# Patient Record
Sex: Male | Born: 1940
Health system: Southern US, Community
[De-identification: ages and names within clinical notes are randomized; demographics above are authoritative.]

## PROBLEM LIST (undated history)

## (undated) DIAGNOSIS — M1990 Unspecified osteoarthritis, unspecified site: Secondary | ICD-10-CM

## (undated) DIAGNOSIS — H8110 Benign paroxysmal vertigo, unspecified ear: Secondary | ICD-10-CM

## (undated) DIAGNOSIS — I340 Nonrheumatic mitral (valve) insufficiency: Secondary | ICD-10-CM

## (undated) DIAGNOSIS — I351 Nonrheumatic aortic (valve) insufficiency: Secondary | ICD-10-CM

## (undated) DIAGNOSIS — E669 Obesity, unspecified: Secondary | ICD-10-CM

## (undated) DIAGNOSIS — I10 Essential (primary) hypertension: Secondary | ICD-10-CM

## (undated) DIAGNOSIS — E785 Hyperlipidemia, unspecified: Secondary | ICD-10-CM

## (undated) DIAGNOSIS — I350 Nonrheumatic aortic (valve) stenosis: Secondary | ICD-10-CM

## (undated) DIAGNOSIS — I2510 Atherosclerotic heart disease of native coronary artery without angina pectoris: Secondary | ICD-10-CM

## (undated) DIAGNOSIS — G4733 Obstructive sleep apnea (adult) (pediatric): Secondary | ICD-10-CM

## (undated) DIAGNOSIS — R413 Other amnesia: Secondary | ICD-10-CM

## (undated) HISTORY — DX: Unspecified osteoarthritis, unspecified site: M19.90

## (undated) HISTORY — DX: Nonrheumatic mitral (valve) insufficiency: I34.0

## (undated) HISTORY — DX: Nonrheumatic aortic (valve) insufficiency: I35.1

## (undated) HISTORY — DX: Other amnesia: R41.3

## (undated) HISTORY — PX: APPENDECTOMY: SHX54

## (undated) HISTORY — PX: TONSILLECTOMY: SUR1361

## (undated) HISTORY — PX: CATARACT EXTRACTION: SUR2

## (undated) HISTORY — DX: Benign paroxysmal vertigo, unspecified ear: H81.10

## (undated) HISTORY — DX: Obesity, unspecified: E66.9

## (undated) HISTORY — PX: AORTIC VALVE REPLACEMENT: SHX41

## (undated) HISTORY — DX: Essential (primary) hypertension: I10

## (undated) HISTORY — PX: FEMORAL HERNIA REPAIR: SHX632

## (undated) HISTORY — PX: OTHER SURGICAL HISTORY: SHX169

## (undated) HISTORY — DX: Hyperlipidemia, unspecified: E78.5

## (undated) HISTORY — DX: Nonrheumatic aortic (valve) stenosis: I35.0

## (undated) HISTORY — DX: Atherosclerotic heart disease of native coronary artery without angina pectoris: I25.10

## (undated) HISTORY — DX: Obstructive sleep apnea (adult) (pediatric): G47.33

---

## 1999-03-26 ENCOUNTER — Encounter: Payer: Self-pay | Admitting: Ophthalmology

## 1999-03-26 ENCOUNTER — Ambulatory Visit (HOSPITAL_COMMUNITY): Admission: RE | Admit: 1999-03-26 | Discharge: 1999-03-27 | Payer: Self-pay | Admitting: Ophthalmology

## 2001-01-03 ENCOUNTER — Encounter: Admission: RE | Admit: 2001-01-03 | Discharge: 2001-04-03 | Payer: Self-pay | Admitting: Family Medicine

## 2003-08-21 ENCOUNTER — Ambulatory Visit (HOSPITAL_BASED_OUTPATIENT_CLINIC_OR_DEPARTMENT_OTHER): Admission: RE | Admit: 2003-08-21 | Discharge: 2003-08-21 | Payer: Self-pay | Admitting: Family Medicine

## 2003-11-18 ENCOUNTER — Ambulatory Visit (HOSPITAL_COMMUNITY): Admission: RE | Admit: 2003-11-18 | Discharge: 2003-11-18 | Payer: Self-pay | Admitting: Gastroenterology

## 2004-11-23 ENCOUNTER — Inpatient Hospital Stay (HOSPITAL_COMMUNITY): Admission: RE | Admit: 2004-11-23 | Discharge: 2004-11-29 | Payer: Self-pay | Admitting: Orthopedic Surgery

## 2004-11-23 ENCOUNTER — Ambulatory Visit: Payer: Self-pay | Admitting: Physical Medicine & Rehabilitation

## 2005-08-23 ENCOUNTER — Inpatient Hospital Stay (HOSPITAL_COMMUNITY): Admission: RE | Admit: 2005-08-23 | Discharge: 2005-08-29 | Payer: Self-pay | Admitting: Orthopedic Surgery

## 2005-08-23 ENCOUNTER — Ambulatory Visit: Payer: Self-pay | Admitting: Physical Medicine & Rehabilitation

## 2006-06-29 ENCOUNTER — Emergency Department (HOSPITAL_COMMUNITY): Admission: EM | Admit: 2006-06-29 | Discharge: 2006-06-30 | Payer: Self-pay | Admitting: Emergency Medicine

## 2011-07-19 ENCOUNTER — Other Ambulatory Visit: Payer: Self-pay | Admitting: Interventional Cardiology

## 2011-07-22 ENCOUNTER — Inpatient Hospital Stay (HOSPITAL_BASED_OUTPATIENT_CLINIC_OR_DEPARTMENT_OTHER)
Admission: RE | Admit: 2011-07-22 | Discharge: 2011-07-22 | Disposition: A | Discharge status: Home / Self Care | Payer: Medicare Other | Source: Ambulatory Visit | Attending: Interventional Cardiology | Admitting: Interventional Cardiology

## 2011-07-22 ENCOUNTER — Encounter (HOSPITAL_BASED_OUTPATIENT_CLINIC_OR_DEPARTMENT_OTHER)
Admission: RE | Disposition: A | Discharge status: Home / Self Care | Payer: Self-pay | Source: Ambulatory Visit | Attending: Interventional Cardiology

## 2011-07-22 DIAGNOSIS — I359 Nonrheumatic aortic valve disorder, unspecified: Secondary | ICD-10-CM | POA: Insufficient documentation

## 2011-07-22 DIAGNOSIS — E785 Hyperlipidemia, unspecified: Secondary | ICD-10-CM | POA: Insufficient documentation

## 2011-07-22 DIAGNOSIS — I2510 Atherosclerotic heart disease of native coronary artery without angina pectoris: Secondary | ICD-10-CM | POA: Insufficient documentation

## 2011-07-22 DIAGNOSIS — Z794 Long term (current) use of insulin: Secondary | ICD-10-CM

## 2011-07-22 DIAGNOSIS — I10 Essential (primary) hypertension: Secondary | ICD-10-CM | POA: Insufficient documentation

## 2011-07-22 DIAGNOSIS — G4733 Obstructive sleep apnea (adult) (pediatric): Secondary | ICD-10-CM | POA: Insufficient documentation

## 2011-07-22 DIAGNOSIS — E118 Type 2 diabetes mellitus with unspecified complications: Secondary | ICD-10-CM | POA: Insufficient documentation

## 2011-07-22 LAB — POCT I-STAT 3, VENOUS BLOOD GAS (G3P V)
Acid-base deficit: 3 mmol/L — ABNORMAL HIGH (ref 0.0–2.0)
Bicarbonate: 23.2 mEq/L (ref 20.0–24.0)
O2 Saturation: 69 %
TCO2: 25 mmol/L (ref 0–100)
pO2, Ven: 39 mmHg (ref 30.0–45.0)

## 2011-07-22 LAB — POCT I-STAT 3, ART BLOOD GAS (G3+)
Acid-base deficit: 1 mmol/L (ref 0.0–2.0)
Acid-base deficit: 1 mmol/L (ref 0.0–2.0)
Bicarbonate: 25.2 mEq/L — ABNORMAL HIGH (ref 20.0–24.0)
O2 Saturation: 93 %
O2 Saturation: 97 %
TCO2: 27 mmol/L (ref 0–100)
pCO2 arterial: 45.6 mmHg — ABNORMAL HIGH (ref 35.0–45.0)
pH, Arterial: 7.35 (ref 7.350–7.450)
pO2, Arterial: 72 mmHg — ABNORMAL LOW (ref 80.0–100.0)
pO2, Arterial: 98 mmHg (ref 80.0–100.0)

## 2011-07-22 LAB — POCT I-STAT GLUCOSE: Glucose, Bld: 142 mg/dL — ABNORMAL HIGH (ref 70–99)

## 2011-07-22 SURGERY — JV LEFT AND RIGHT HEART CATHETERIZATION WITH CORONARY ANGIOGRAM
Anesthesia: Moderate Sedation

## 2011-07-22 MED ORDER — SIMVASTATIN 40 MG PO TABS: 40.0000 mg | ORAL_TABLET | Freq: Every day | ORAL | Status: DC

## 2011-07-22 MED ORDER — OXYCODONE-ACETAMINOPHEN 5-325 MG PO TABS: 1.0000 | ORAL_TABLET | ORAL | Status: DC | PRN

## 2011-07-22 MED ORDER — SODIUM CHLORIDE 0.9 % IJ SOLN: 3.0000 mL | INTRAMUSCULAR | Status: DC | PRN

## 2011-07-22 MED ORDER — ASPIRIN 81 MG PO CHEW
324.0000 mg | CHEWABLE_TABLET | ORAL | Status: AC
  Administered 2011-07-22: 324 mg via ORAL

## 2011-07-22 MED ORDER — ONDANSETRON HCL 4 MG/2ML IJ SOLN: 4.0000 mg | Freq: Four times a day (QID) | INTRAMUSCULAR | Status: DC | PRN

## 2011-07-22 MED ORDER — SODIUM CHLORIDE 0.9 % IV SOLN: INTRAVENOUS | Status: DC

## 2011-07-22 MED ORDER — SODIUM CHLORIDE 0.9 % IJ SOLN: 3.0000 mL | Freq: Two times a day (BID) | INTRAMUSCULAR | Status: DC

## 2011-07-22 MED ORDER — ACETAMINOPHEN 325 MG PO TABS: 650.0000 mg | ORAL_TABLET | ORAL | Status: DC | PRN

## 2011-07-22 MED ORDER — AMLODIPINE BESYLATE 5 MG PO TABS: 5.0000 mg | ORAL_TABLET | Freq: Every day | ORAL | Status: DC

## 2011-07-22 MED ORDER — SODIUM CHLORIDE 0.9 % IV SOLN
INTRAVENOUS | Status: DC
  Administered 2011-07-22: 07:00:00 via INTRAVENOUS

## 2011-07-22 MED ORDER — DIAZEPAM 5 MG PO TABS
5.0000 mg | ORAL_TABLET | ORAL | Status: AC
  Administered 2011-07-22: 5 mg via ORAL

## 2011-07-22 MED ORDER — RAMIPRIL 10 MG PO TABS: 10.0000 mg | ORAL_TABLET | Freq: Every day | ORAL | Status: DC

## 2011-07-22 MED ORDER — SODIUM CHLORIDE 0.9 % IV SOLN: 250.0000 mL | INTRAVENOUS | Status: DC | PRN

## 2011-07-22 MED ORDER — METFORMIN HCL 500 MG PO TABS: 1000.0000 mg | ORAL_TABLET | ORAL | Status: DC

## 2011-07-22 NOTE — Op Note (Signed)
     Diagnostic Cardiac Catheterization Report  NIKKO GOLDWIRE  71 y.o.  male February 01, 1941  Procedure Date: 07/22/2011 Referring Physician:  Catha Gosselin, M.D. Primary Cardiologist:: Gwynneth Albright, M.D.   PROCEDURE:  Left and right heart catheterization with selective coronary angiography, left ventriculogram.  INDICATIONS:  Severe aortic stenosis. The studies being done to document coronary anatomy and confirm noninvasive hemodynamics.  The risks, benefits, and details of the procedure were explained to the patient.  The patient verbalized understanding and wanted to proceed.  Informed written consent was obtained.  PROCEDURE TECHNIQUE:  After Xylocaine anesthesia a 4 French arterial and 7 French venous sheath was placed in the right femoral artery and vein respectively, each with a single anterior needle wall stick.   Coronary angiography was done using a 4 [place] JL 4 and JR 4 catheter.  Left ventriculography was done using the 4 [place] JR 4 catheter. A straight wire was used to cross the aortic valve using the JR 4 cath.    CONTRAST:  Total of 65 cc.  COMPLICATIONS:  None.    HEMODYNAMICS:  Aortic pressure was 107/52 mmHg; LV pressure was 165/9 mmHg; LVEDP 17 mm mercury; right atrial mean 4 mm mercury; right ventricular 29/8 mmHg; main pulmonary artery 25/8 mmHg; pulmonary capillary wedge 7 mmHg mean. The peak to peak gradient 58 mmHg; mean gradient 41 mm mercury; cardiac outputs 5.6 L per minute (by both Fick and thermodilution). Aortic valve area 1.03 cm square.     ANGIOGRAPHIC DATA:   The left main coronary artery is heavy calcification but widely patent.  The left anterior descending artery is heavy proximal LAD and diagonal calcification with up to 40% mid vessel stenosis just beyond the first diagonal. The LAD is transapical. One large first diagonal contains an eccentric ostial 50-70% stenosis..  The left circumflex artery is to, the obtuse marginal branches with the first  marginal containing proximal eccentric 40-50% narrowing irregularities are noted in the mid circumflex no significant stenosis is seen throughout the circumflex system. The circumflex is codominant..  The right coronary artery is codominant with proximal eccentric 50% narrowing. Significant calcification is noted.  LEFT VENTRICULOGRAM:  Left ventricular angiogram was done in the 30 RAO projection and revealed normal left ventricular wall motion and systolic function with an estimated ejection fraction of 60% %.  LVEDP was 17 mmHg.  IMPRESSIONS:  1. Severe calcific aortic stenosis with peak to peak gradient of 58 mmHg, mean gradient of 41 mm mercury, and calculated valve area of 1.03 cm square.  2. Heavy coronary calcification with up to 50% eccentric mid to proximal LAD, 50-70% ostial diagonal, 40% first obtuse marginal, and 50-60% proximal codominant RCA. I believe that all of the patient's coronary lesions are less than hemodynamically significant. He has no anginal complaints.  3. Preserved left ventricular systolic function with mildly elevated end-diastolic pressure consistent with mild diastolic dysfunction.   RECOMMENDATION:  Followup with a cardiac surgeon to contemplate and plan aortic valve replacement, potentially by a minithoracotomy approach.

## 2011-07-22 NOTE — Progress Notes (Signed)
Bedrest begins @ 0845, Dr. Katrinka Blazing in to discuss results with patient and family.

## 2011-07-22 NOTE — H&P (Signed)
  The patient has aortic stenosis documented by echo. He has exertional dyspnea and fatigue. He has undergone heart catheterization to complete evaluation and to be referred for surgical considerations. The procedure including its risks of stroke, death, heart attack, renal failure, limb ischemia, bleeding, allergy, etc. were discussed in detail except above patient.

## 2011-07-22 NOTE — Progress Notes (Signed)
Discharge instructions completed, ambulated to bathroom without bleeding from right groin site.  Discharged to home via wheelchair with wife and family. 

## 2011-07-25 ENCOUNTER — Encounter: Payer: Self-pay | Admitting: Thoracic Surgery (Cardiothoracic Vascular Surgery)

## 2011-07-25 ENCOUNTER — Other Ambulatory Visit: Payer: Self-pay | Admitting: Thoracic Surgery (Cardiothoracic Vascular Surgery)

## 2011-07-25 ENCOUNTER — Institutional Professional Consult (permissible substitution) (INDEPENDENT_AMBULATORY_CARE_PROVIDER_SITE_OTHER): Payer: Medicare Other | Admitting: Thoracic Surgery (Cardiothoracic Vascular Surgery)

## 2011-07-25 VITALS — BP 142/77 | HR 62 | Resp 20 | Ht 72.75 in | Wt 242.0 lb

## 2011-07-25 DIAGNOSIS — I77810 Thoracic aortic ectasia: Secondary | ICD-10-CM

## 2011-07-25 DIAGNOSIS — I359 Nonrheumatic aortic valve disorder, unspecified: Secondary | ICD-10-CM

## 2011-07-25 DIAGNOSIS — M1990 Unspecified osteoarthritis, unspecified site: Secondary | ICD-10-CM | POA: Insufficient documentation

## 2011-07-25 DIAGNOSIS — I82409 Acute embolism and thrombosis of unspecified deep veins of unspecified lower extremity: Secondary | ICD-10-CM

## 2011-07-25 DIAGNOSIS — I350 Nonrheumatic aortic (valve) stenosis: Secondary | ICD-10-CM

## 2011-07-25 DIAGNOSIS — I7409 Other arterial embolism and thrombosis of abdominal aorta: Secondary | ICD-10-CM

## 2011-07-25 NOTE — Progress Notes (Signed)
[address]            [place] [address]          [phone]     CARDIOTHORACIC SURGERY CONSULTATION REPORT  PCP is Patrick Hillier, MD, MD Referring Provider is Patrick Kells, MD  Chief Complaint  Patient presents with  . 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 The patient states that he was first noted to have a heart murmur on routine physical exam several years ago. An echocardiogram revealed aortic stenosis, and he has been followed ever since on a regular basis. Over the past year the patient has developed symptoms of exertional shortness of breath that typically only occur when the patient is doing something relatively strenuous such as mowing his lawn or walking uphill. He noticed this particularly last fall walking into the football stadium at [facility] on the way to a football game. Symptoms are associated with mild tightness across her chest and always relieved by rest. The patient was seen in followup recently by Dr. Katrinka Henson and echocardiogram demonstrates progression of severity of aortic stenosis. Left and right heart catheterization was performed confirming the presence of severe aortic stenosis with moderate nonobstructive coronary artery disease. The patient has been referred for possible elective surgical intervention.  Past Medical History  Diagnosis Date  . Aortic stenosis     AVA .96 cm2.2011  . Diabetes mellitus   . Hypertension   . Hyperlipidemia   . Arthritis   . Obstructive sleep apnea     Past Surgical History  Procedure Date  . Right hip placement     2007  . Left knee replacement     2006  . Appendectomy   . Tonsillectomy   . Femoral hernia repair     History reviewed. No pertinent  family history.  Social History History  Substance Use Topics  . Smoking status: Never Smoker   . Smokeless tobacco: Never Used  . Alcohol Use: Not on file    Current Outpatient Prescriptions  Medication Sig Dispense Refill  . amLODipine (NORVASC) 5 MG tablet Take 5 mg by mouth daily.      Marland Kitchen atorvastatin (LIPITOR) 20 MG tablet Take 20 mg by mouth daily.      . metFORMIN (GLUCOPHAGE) 1000 MG tablet Take 1,000 mg by mouth 1 day or 1 dose.      . ramipril (ALTACE) 10 MG tablet Take 10 mg by mouth daily.      . pioglitazone (ACTOS) 15 MG tablet Take 15 mg by mouth daily.         No Known Allergies  Review of Systems:  General:  normal appetite, normal energy   Respiratory:  no cough, no wheezing, no hemoptysis, no pain with inspiration or cough, + exertional shortness of breath   Cardiac:  + chest pain or tightness with exertion, + exertional SOB, no resting SOB, no PND, no orthopnea, + LE edema R>L, no palpitations, no syncope, occasional dizziness  GI:   no difficulty swallowing, no hematochezia, no hematemesis, no melena, no constipation, no diarrhea   GU:   no dysuria, no urgency, + nocturia and frequency   Musculoskeletal: +  arthritis and arthralgias, particularly in hips and knees  Vascular:  no pain suggestive of claudication   Neuro:   no symptoms suggestive of TIA's, no seizures, no headaches, no peripheral neuropathy, + gradual decline in short and medium term memory over last few years, particularly this year  Endocrine:  Negative   Heme:   Easy bruising on ASA therapy  HEENT:  no loose teeth or painful teeth,  no recent vision changes  Psych:   no anxiety, no depression    Physical Exam:   BP 142/77  Pulse 62  Resp 20  Ht 6' 0.75" (1.848 m)  Wt 242 lb (109.77 kg)  BMI 32.15 kg/m2  SpO2 99%  General:    well-appearing  HEENT:  Unremarkable   Neck:   no JVD, no bruits, no adenopathy   Chest:   clear to auscultation, symmetrical breath sounds, no wheezes, no  rhonchi   CV:   RRR, grade III/VI systolic murmur along sternal border  Abdomen:  soft, non-tender, no masses   Extremities:  warm, well-perfused, pulses not palpable, moderate-severe RLE edema, mild-moderate LLE edema  Rectal/GU  Deferred  Neuro:   Grossly non-focal and symmetrical throughout  Skin:   Clean and dry, no rashes, no breakdown, hair loss and chronic venous insuff. B LE's R>L   Diagnostic Tests:  Transthoracic echocardiogram performed 06/27/2011 is reviewed. There is severe calcific aortic stenosis. There is severe restriction of leaflet motion of aortic valve with heavy calcification. There is only trace aortic insufficiency. The peak velocity across the valve was measured 426 cm/s corresponding to peak and mean transvalvular gradients estimated to be 73 and 48 mm mercury respectively. Aortic valve area was estimated 0.9 cm. There was normal left ventricular systolic function with ejection fraction estimated to be greater than 60%. There was mild mitral annular calcification without mitral regurgitation. There is moderate concentric left ventricular hypertrophy. There is grade 2 diastolic dysfunction with elevated left atrial pressure. There was trivial tricuspid regurgitation.       Diagnostic Cardiac Catheterization Report  Patrick Henson  71 y.o.   male 02-Sep-1940  Procedure Date: 07/22/2011 Referring Physician:  Catha Henson, M.D. Primary Cardiologist:: Patrick Henson, M.D.   PROCEDURE:  Left and right heart catheterization with selective coronary angiography, left ventriculogram.  HEMODYNAMICS:  Aortic pressure was 107/52 mmHg; LV pressure was 165/9 mmHg; LVEDP 17 mm mercury; right atrial mean 4 mm mercury; right ventricular 29/8 mmHg; main pulmonary artery 25/8 mmHg; pulmonary capillary wedge 7 mmHg mean. The peak to peak gradient 58 mmHg; mean gradient 41 mm mercury; cardiac outputs 5.6 L per minute (by both Fick and thermodilution). Aortic valve area 1.03 cm  square.     ANGIOGRAPHIC DATA:   The left main coronary artery is heavy calcification but widely patent.  The left anterior descending artery is heavy proximal LAD and diagonal calcification with up to 40% mid vessel stenosis just beyond the first diagonal. The LAD is transapical. One large first diagonal contains an eccentric ostial 50-70% stenosis..  The left circumflex artery is to, the obtuse marginal branches with the first marginal containing proximal eccentric 40-50% narrowing irregularities are noted in the mid circumflex no significant stenosis is seen throughout the circumflex system. The circumflex is codominant..  The right coronary artery is codominant with proximal eccentric 50% narrowing. Significant calcification is noted.  LEFT VENTRICULOGRAM:  Left ventricular angiogram was done in the 30 RAO projection and revealed normal left ventricular wall motion and systolic  function with an estimated ejection fraction of 60% %.  LVEDP was 17 mmHg.            Impression:  Severe calcific aortic stenosis with normal left ventricular systolic function and gradual aggression of symptoms of exertional shortness of breath and chest discomfort. I agree that the patient would best be treated with elective aortic valve replacement. He has moderate nonobstructive coronary artery disease.  He does have fairly severe lower extremity edema which is and reportedly has been quite asymmetric, raising the possibility of possible DVT in the past.  He also reports some noticeable cognitive decline recently with memory disturbance.  He is interested in possible minimally invasive approach for surgery.    Plan:  The patient was counseled at length regarding surgical alternatives with respect to valve replacement. In particular, discussion was held comparing in contrast and the risks of mechanical valve replacement and the need for lifelong anticoagulation versus use of a bioprosthetic tissue valve and  the associated potential for late structural valve deterioration in failure.  This discussion was placed in the context of the patient's age and other circumstances, and as a result the patient specifically requests that their valve be replaced using a bioprosthetic tissue valve.  They understand and accept all potential associated risks of surgery including but not limited to risk of death, stroke, myocardial infarction, congestive heart failure, respiratory failure, renal failure, pneumonia, bleeding requiring blood transfusion and or reexploration, arrhythmia, heart block or bradycardia requiring permanent pacemaker, pleural effusions or other delayed complications related to continued congestive heart failure, or other late complications related to valve replacement.  Alternative surgical approaches have been discussed, including a comparison between conventional sternotomy and minimally-invasive techniques.  The relative risks and benefits of each have been reviewed as they pertain to the patient's specific circumstances, and all of their questions have been addressed.  We plan to obtain lower extremity duplex scan to rule out deep venous thrombosis. We will obtain cardiac gated CT angiogram of the heart to evaluate the morphology of the aortic valve and aortic root to better ascertain whether or not he will be a good candidate for mini thoracotomy approach for surgery. We'll also obtain CT angiogram of the abdomen and pelvis to rule out aortoiliac occlusive disease because possible need for femoral cannulation for surgery. We will plan to see the patient back in 2 weeks to further discuss plans for possible elective surgery in the near future. All of his questions been addressed.      Patrick Henson. Cornelius Moras, MD 07/25/2011 3:27 PM

## 2011-07-26 ENCOUNTER — Ambulatory Visit (HOSPITAL_COMMUNITY)
Admission: RE | Admit: 2011-07-26 | Discharge: 2011-07-26 | Disposition: A | Discharge status: Home / Self Care | Payer: Medicare Other | Source: Ambulatory Visit | Attending: Thoracic Surgery (Cardiothoracic Vascular Surgery) | Admitting: Thoracic Surgery (Cardiothoracic Vascular Surgery)

## 2011-07-26 ENCOUNTER — Other Ambulatory Visit: Payer: Self-pay | Admitting: Thoracic Surgery (Cardiothoracic Vascular Surgery)

## 2011-07-26 DIAGNOSIS — I719 Aortic aneurysm of unspecified site, without rupture: Secondary | ICD-10-CM

## 2011-07-26 DIAGNOSIS — I82409 Acute embolism and thrombosis of unspecified deep veins of unspecified lower extremity: Secondary | ICD-10-CM

## 2011-07-26 DIAGNOSIS — Q2543 Congenital aneurysm of aorta: Secondary | ICD-10-CM

## 2011-07-26 DIAGNOSIS — M7989 Other specified soft tissue disorders: Secondary | ICD-10-CM | POA: Insufficient documentation

## 2011-07-26 DIAGNOSIS — I350 Nonrheumatic aortic (valve) stenosis: Secondary | ICD-10-CM

## 2011-07-26 DIAGNOSIS — M79609 Pain in unspecified limb: Secondary | ICD-10-CM | POA: Insufficient documentation

## 2011-07-26 NOTE — Progress Notes (Signed)
VASCULAR LAB PRELIMINARY  PRELIMINARY  PRELIMINARY  PRELIMINARY  Bilateral lower extremity venous has been completed.    Preliminary report: No obvious evidence of deep or superficial vein thrombosis noted bilaterally. Vanna Scotland, 07/26/2011, 11:40 AM

## 2011-07-29 ENCOUNTER — Ambulatory Visit (HOSPITAL_COMMUNITY)
Admission: RE | Admit: 2011-07-29 | Discharge: 2011-07-29 | Disposition: A | Discharge status: Home / Self Care | Payer: Medicare Other | Source: Ambulatory Visit | Attending: Thoracic Surgery (Cardiothoracic Vascular Surgery) | Admitting: Thoracic Surgery (Cardiothoracic Vascular Surgery)

## 2011-07-29 ENCOUNTER — Encounter (HOSPITAL_COMMUNITY): Payer: Self-pay

## 2011-07-29 ENCOUNTER — Other Ambulatory Visit: Payer: Self-pay | Admitting: Thoracic Surgery (Cardiothoracic Vascular Surgery)

## 2011-07-29 ENCOUNTER — Inpatient Hospital Stay: Admission: RE | Admit: 2011-07-29 | Payer: Medicare Other | Source: Ambulatory Visit

## 2011-07-29 DIAGNOSIS — K8020 Calculus of gallbladder without cholecystitis without obstruction: Secondary | ICD-10-CM | POA: Insufficient documentation

## 2011-07-29 DIAGNOSIS — Q619 Cystic kidney disease, unspecified: Secondary | ICD-10-CM | POA: Insufficient documentation

## 2011-07-29 DIAGNOSIS — I7409 Other arterial embolism and thrombosis of abdominal aorta: Secondary | ICD-10-CM | POA: Insufficient documentation

## 2011-07-29 DIAGNOSIS — I77819 Aortic ectasia, unspecified site: Secondary | ICD-10-CM | POA: Insufficient documentation

## 2011-07-29 DIAGNOSIS — I359 Nonrheumatic aortic valve disorder, unspecified: Secondary | ICD-10-CM

## 2011-07-29 DIAGNOSIS — I350 Nonrheumatic aortic (valve) stenosis: Secondary | ICD-10-CM

## 2011-07-29 DIAGNOSIS — I77810 Thoracic aortic ectasia: Secondary | ICD-10-CM

## 2011-07-29 MED ORDER — NITROGLYCERIN 0.4 MG SL SUBL
0.4000 mg | SUBLINGUAL_TABLET | Freq: Once | SUBLINGUAL | Status: AC
  Administered 2011-07-29: 0.4 mg via SUBLINGUAL
  Filled 2011-07-29: qty 25

## 2011-07-29 MED ORDER — IOHEXOL 350 MG/ML SOLN: 70.0000 mL | Freq: Once | INTRAVENOUS | Status: AC | PRN

## 2011-07-29 MED ORDER — IOHEXOL 350 MG/ML SOLN: 80.0000 mL | Freq: Once | INTRAVENOUS | Status: DC | PRN

## 2011-07-29 MED ORDER — METOPROLOL TARTRATE 1 MG/ML IV SOLN
INTRAVENOUS | Status: AC
  Filled 2011-07-29: qty 10

## 2011-07-29 MED ORDER — NITROGLYCERIN 0.4 MG SL SUBL
SUBLINGUAL_TABLET | SUBLINGUAL | Status: AC
  Filled 2011-07-29: qty 25

## 2011-07-29 MED ORDER — METOPROLOL TARTRATE 1 MG/ML IV SOLN
5.0000 mg | Freq: Once | INTRAVENOUS | Status: AC
  Administered 2011-07-29: 5 mg via INTRAVENOUS
  Filled 2011-07-29: qty 5

## 2011-08-08 ENCOUNTER — Ambulatory Visit: Payer: Medicare Other | Admitting: Thoracic Surgery (Cardiothoracic Vascular Surgery)

## 2011-10-06 ENCOUNTER — Encounter (HOSPITAL_COMMUNITY)
Admission: RE | Admit: 2011-10-06 | Discharge: 2011-10-06 | Disposition: A | Discharge status: Home / Self Care | Payer: Medicare Other | Source: Ambulatory Visit | Attending: Interventional Cardiology | Admitting: Interventional Cardiology

## 2011-10-06 DIAGNOSIS — I10 Essential (primary) hypertension: Secondary | ICD-10-CM | POA: Insufficient documentation

## 2011-10-06 DIAGNOSIS — E119 Type 2 diabetes mellitus without complications: Secondary | ICD-10-CM | POA: Insufficient documentation

## 2011-10-06 DIAGNOSIS — G4733 Obstructive sleep apnea (adult) (pediatric): Secondary | ICD-10-CM | POA: Insufficient documentation

## 2011-10-06 DIAGNOSIS — I359 Nonrheumatic aortic valve disorder, unspecified: Secondary | ICD-10-CM | POA: Insufficient documentation

## 2011-10-06 DIAGNOSIS — E785 Hyperlipidemia, unspecified: Secondary | ICD-10-CM | POA: Insufficient documentation

## 2011-10-06 DIAGNOSIS — Z5189 Encounter for other specified aftercare: Secondary | ICD-10-CM | POA: Insufficient documentation

## 2011-10-10 ENCOUNTER — Encounter (HOSPITAL_COMMUNITY)
Admission: RE | Admit: 2011-10-10 | Discharge: 2011-10-10 | Disposition: A | Discharge status: Home / Self Care | Payer: Medicare Other | Source: Ambulatory Visit | Attending: Interventional Cardiology | Admitting: Interventional Cardiology

## 2011-10-10 ENCOUNTER — Encounter (HOSPITAL_COMMUNITY): Payer: Self-pay

## 2011-10-10 LAB — GLUCOSE, CAPILLARY
Glucose-Capillary: 174 mg/dL — ABNORMAL HIGH (ref 70–99)
Glucose-Capillary: 115 mg/dL — ABNORMAL HIGH (ref 70–99)

## 2011-10-10 NOTE — Progress Notes (Signed)
Pt started cardiac rehab today.  Pt tolerated light exercise without difficulty.  Pt asymptomatic with exercise.  Telemetry- SR, BBB, PVC with exercise.  VSS.  Pt oriented to exercise equipment and routine.  Understanding verbalized.  Will continue to monitor the patient throughout  the program.

## 2011-10-12 ENCOUNTER — Encounter (HOSPITAL_COMMUNITY)
Admission: RE | Admit: 2011-10-12 | Discharge: 2011-10-12 | Disposition: A | Discharge status: Home / Self Care | Payer: Medicare Other | Source: Ambulatory Visit | Attending: Interventional Cardiology | Admitting: Interventional Cardiology

## 2011-10-13 NOTE — Progress Notes (Signed)
Patrick Henson 71 y.o. male       Nutrition Screen                                                                    YES  NO Do you live in a nursing home?  X   Do you eat out more than 3 times/week?    X If yes, how many times per week do you eat out?  Do you have food allergies?   X If yes, what are you allergic to?  Have you gained or lost more than 10 lbs without trying?               X If yes, how much weight have you lost and over what time period?  Do you want to lose weight?    X  If yes, what is a goal weight or amount of weight you would like to lose? 5-10 lb  Do you eat alone most of the time?   X   Do you eat less than 2 meals/day?  X If yes, how many meals do you eat?    Do you drink more than 3 alcohol drinks/day?  X If yes, how many drinks per day?   Are you having trouble with constipation? *  X If yes, what are you doing to help relieve constipation?  Do you have financial difficulties with buying food?*    X   Are you experiencing regular nausea/ vomiting?*     X   Do you have a poor appetite? *                                        X   Do you have trouble chewing/swallowing? *   X    Pt with diagnoses of:  X AVR/MVR/ICD      X Dyslipidemia  / HDL< 40 / LDL>70 / High TG      X %  Body fat >goal / Body Mass Index >25 X HTN / BP >120/80 X DM/A1c >6 / CBG >126       Pt Risk Score   1       Diagnosis Risk Score  70       Total Risk Score   71                        X High Risk                Low Risk    HT: 73.5" Ht Readings from Last 1 Encounters:  10/06/11 6' 1.5" (1.867 m)    WT:   240.9 lb (109.5 kg) Wt Readings from Last 3 Encounters:  10/06/11 241 lb 6.5 oz (109.5 kg)  07/25/11 242 lb (109.77 kg)  07/22/11 244 lb (110.678 kg)     IBW 85 129%IBW BMI 31.4 29.2%body fat  Meds reviewed: Chelated Magnesium, Vitamin D3, Metformin, Juice Plus fiber supplement, Fish oil, Actos Past Medical History  Diagnosis Date  . Aortic stenosis     AVA .96 cm2.2011  .  Diabetes mellitus   . Hypertension   .  Hyperlipidemia   . Arthritis   . Obstructive sleep apnea        Activity level: Pt is sedentary  Wt goal: 216-228 lb ( 98.2-103.6 kg) Current tobacco use? No Food/Drug Interaction? No Labs:  Lipid Panel  No results found for this basename: chol, trig, hdl, cholhdl, vldl, ldlcalc   No results found for this basename: HGBA1C   07/22/11 Glucose 142  LDL goal: < 70      DM and > 2:      HTN, > 71 yo male Estimated Daily Nutrition Needs for: ? wt loss  1700-2200 Kcal , Total Fat 45-60gm, Saturated Fat 12-17 gm, Trans Fat 1.6-2.2 gm,  Sodium less than 1500 mg, and 250 gm CHO

## 2011-10-14 ENCOUNTER — Encounter (HOSPITAL_COMMUNITY)
Admission: RE | Admit: 2011-10-14 | Discharge: 2011-10-14 | Disposition: A | Discharge status: Home / Self Care | Payer: Medicare Other | Source: Ambulatory Visit | Attending: Interventional Cardiology | Admitting: Interventional Cardiology

## 2011-10-17 ENCOUNTER — Encounter (HOSPITAL_COMMUNITY)
Admission: RE | Admit: 2011-10-17 | Discharge: 2011-10-17 | Payer: Medicare Other | Source: Ambulatory Visit | Attending: Interventional Cardiology | Admitting: Interventional Cardiology

## 2011-10-17 NOTE — Progress Notes (Signed)
Pt arrived at cardiac rehab c/o low grade fever, cough and bilateral rib discomfort X2-3days.  Pt states he has appt today with PCP. Pt will not exercise and will return when afebrile X24 hours and cough/congestion resolved.   Understanding verbalized

## 2011-10-19 ENCOUNTER — Telehealth (HOSPITAL_COMMUNITY): Payer: Self-pay | Admitting: Cardiac Rehabilitation

## 2011-10-19 ENCOUNTER — Encounter (HOSPITAL_COMMUNITY): Payer: Medicare Other

## 2011-10-21 ENCOUNTER — Encounter (HOSPITAL_COMMUNITY): Payer: Medicare Other

## 2011-10-26 ENCOUNTER — Encounter (HOSPITAL_COMMUNITY)
Admission: RE | Admit: 2011-10-26 | Discharge: 2011-10-26 | Disposition: A | Discharge status: Home / Self Care | Payer: Medicare Other | Source: Ambulatory Visit | Attending: Interventional Cardiology | Admitting: Interventional Cardiology

## 2011-10-26 NOTE — Progress Notes (Signed)
Reviewed home exercise with pt today.  Pt plans to walk at home for exercise.  Reviewed THR, pulse, RPE, sign and symptoms, and when to call 911 or MD.  Pt voiced understanding. Electronically signed by Harriett Sine MS on Wednesday Oct 26 2011 at [phone]

## 2011-10-28 ENCOUNTER — Encounter (HOSPITAL_COMMUNITY)
Admission: RE | Admit: 2011-10-28 | Discharge: 2011-10-28 | Disposition: A | Discharge status: Home / Self Care | Payer: Medicare Other | Source: Ambulatory Visit | Attending: Interventional Cardiology | Admitting: Interventional Cardiology

## 2011-10-28 NOTE — Progress Notes (Signed)
Patrick Henson 71 y.o. male Nutrition Note  Spoke with pt.  Nutrition Plan, Nutrition Survey, and cholesterol goals reviewed with pt. Pt is not following the Therapeutic Lifestyle Changes diet. There are many areas in pt diet that need improvement for pt diet to become heart healthy. Areas for improvement reviewed in detail (e.g. Switching from 2% milk to 1% or skim, choosing low-fat cheese, low-fat salad dressing and mayo). Pt states he wants to lose wt. Pt wt is down 2.4 kg (5.3 lb) over the past 2 weeks. Rate of wt loss slightly greater than desired rate. Wt loss tips discussed. Pt is diabetic. Per pt, he saw Dr. Sharl Ma last week and his A1c was "5.9 or 6.1." Pt reports he was taken off of insulin and Actos and remains on Metformin. Pt states he has been previously educated re: DM. DM education classes offered through Cardiac Rehab reviewed with pt. Pt expressed understanding of the above information reviewed. Nutrition Diagnosis   Food-and nutrition-related knowledge deficit related to lack of exposure to information as related to diagnosis of: ? CVD ? DM   Obesity related to excessive energy intake as evidenced by a BMI of 31.4  Nutrition RX/ Estimated Daily Nutrition Needs for: wt loss  1700-2200 Kcal, 45-60 gm fat, 12-17 gm sat fat, 1.6-2.2 gm trans-fat, <1500 mg sodium , 250 gm CHO   Nutrition Intervention   Pt's individual nutrition plan including cholesterol goals reviewed with pt.   Benefits of adopting Therapeutic Lifestyle Changes discussed when Medficts reviewed.   Pt to attend the Portion Distortion class   Pt to attend the  ? Nutrition I class                          ? Nutrition II class        ? Diabetes Blitz class       ? Diabetes Q & A class   Pt given handouts for: ? wt loss ? DM    Continue client-centered nutrition education by RD, as part of interdisciplinary care. Goal(s)   Pt to identify and limit food sources of saturated fat, trans fat, and cholesterol   Pt to identify  food quantities necessary to achieve: ? wt loss to a goal wt of 216-228 lb (98.2-103.6 kg) at graduation from cardiac rehab.  Monitor and Evaluate progress toward nutrition goal with team.

## 2011-10-31 ENCOUNTER — Encounter (HOSPITAL_COMMUNITY)
Admission: RE | Admit: 2011-10-31 | Discharge: 2011-10-31 | Disposition: A | Discharge status: Home / Self Care | Payer: Medicare Other | Source: Ambulatory Visit | Attending: Interventional Cardiology | Admitting: Interventional Cardiology

## 2011-10-31 DIAGNOSIS — Z5189 Encounter for other specified aftercare: Secondary | ICD-10-CM | POA: Insufficient documentation

## 2011-10-31 DIAGNOSIS — I10 Essential (primary) hypertension: Secondary | ICD-10-CM | POA: Insufficient documentation

## 2011-10-31 DIAGNOSIS — E119 Type 2 diabetes mellitus without complications: Secondary | ICD-10-CM | POA: Insufficient documentation

## 2011-10-31 DIAGNOSIS — I359 Nonrheumatic aortic valve disorder, unspecified: Secondary | ICD-10-CM | POA: Insufficient documentation

## 2011-10-31 DIAGNOSIS — E785 Hyperlipidemia, unspecified: Secondary | ICD-10-CM | POA: Insufficient documentation

## 2011-10-31 DIAGNOSIS — G4733 Obstructive sleep apnea (adult) (pediatric): Secondary | ICD-10-CM | POA: Insufficient documentation

## 2011-11-02 ENCOUNTER — Encounter (HOSPITAL_COMMUNITY)
Admission: RE | Admit: 2011-11-02 | Discharge: 2011-11-02 | Disposition: A | Discharge status: Home / Self Care | Payer: Medicare Other | Source: Ambulatory Visit | Attending: Interventional Cardiology | Admitting: Interventional Cardiology

## 2011-11-04 ENCOUNTER — Encounter (HOSPITAL_COMMUNITY)
Admission: RE | Admit: 2011-11-04 | Discharge: 2011-11-04 | Disposition: A | Discharge status: Home / Self Care | Payer: Medicare Other | Source: Ambulatory Visit | Attending: Interventional Cardiology | Admitting: Interventional Cardiology

## 2011-11-07 ENCOUNTER — Encounter (HOSPITAL_COMMUNITY)
Admission: RE | Admit: 2011-11-07 | Discharge: 2011-11-07 | Disposition: A | Discharge status: Home / Self Care | Payer: Medicare Other | Source: Ambulatory Visit | Attending: Interventional Cardiology | Admitting: Interventional Cardiology

## 2011-11-09 ENCOUNTER — Encounter (HOSPITAL_COMMUNITY)
Admission: RE | Admit: 2011-11-09 | Discharge: 2011-11-09 | Disposition: A | Discharge status: Home / Self Care | Payer: Medicare Other | Source: Ambulatory Visit | Attending: Interventional Cardiology | Admitting: Interventional Cardiology

## 2011-11-09 NOTE — Progress Notes (Signed)
Pt hypertensive at rest and with exercise today at cardiac rehab. Pt denies missed meds or change in diet.  Pt has appt today with Dr. Katrinka Blazing.  Rehab report sent with pt and phone call to Dr. Michaelle Copas nurse to make aware.

## 2011-11-11 ENCOUNTER — Encounter (HOSPITAL_COMMUNITY)
Admission: RE | Admit: 2011-11-11 | Discharge: 2011-11-11 | Disposition: A | Discharge status: Home / Self Care | Payer: Medicare Other | Source: Ambulatory Visit | Attending: Interventional Cardiology | Admitting: Interventional Cardiology

## 2011-11-14 ENCOUNTER — Encounter (HOSPITAL_COMMUNITY)
Admission: RE | Admit: 2011-11-14 | Discharge: 2011-11-14 | Disposition: A | Discharge status: Home / Self Care | Payer: Medicare Other | Source: Ambulatory Visit | Attending: Interventional Cardiology | Admitting: Interventional Cardiology

## 2011-11-16 ENCOUNTER — Encounter (HOSPITAL_COMMUNITY)
Admission: RE | Admit: 2011-11-16 | Discharge: 2011-11-16 | Disposition: A | Discharge status: Home / Self Care | Payer: Medicare Other | Source: Ambulatory Visit | Attending: Interventional Cardiology | Admitting: Interventional Cardiology

## 2011-11-18 ENCOUNTER — Encounter (HOSPITAL_COMMUNITY): Payer: Medicare Other

## 2011-11-21 ENCOUNTER — Encounter (HOSPITAL_COMMUNITY): Payer: Medicare Other

## 2011-11-23 ENCOUNTER — Encounter (HOSPITAL_COMMUNITY): Payer: Medicare Other

## 2011-11-25 ENCOUNTER — Encounter (HOSPITAL_COMMUNITY): Payer: Medicare Other

## 2011-11-28 ENCOUNTER — Encounter (HOSPITAL_COMMUNITY): Payer: Medicare Other

## 2011-11-30 ENCOUNTER — Encounter (HOSPITAL_COMMUNITY)
Admission: RE | Admit: 2011-11-30 | Discharge: 2011-11-30 | Disposition: A | Discharge status: Home / Self Care | Payer: Medicare Other | Source: Ambulatory Visit | Attending: Interventional Cardiology | Admitting: Interventional Cardiology

## 2011-11-30 DIAGNOSIS — I359 Nonrheumatic aortic valve disorder, unspecified: Secondary | ICD-10-CM | POA: Insufficient documentation

## 2011-11-30 DIAGNOSIS — G4733 Obstructive sleep apnea (adult) (pediatric): Secondary | ICD-10-CM | POA: Insufficient documentation

## 2011-11-30 DIAGNOSIS — Z5189 Encounter for other specified aftercare: Secondary | ICD-10-CM | POA: Insufficient documentation

## 2011-11-30 DIAGNOSIS — E119 Type 2 diabetes mellitus without complications: Secondary | ICD-10-CM | POA: Insufficient documentation

## 2011-11-30 DIAGNOSIS — E785 Hyperlipidemia, unspecified: Secondary | ICD-10-CM | POA: Insufficient documentation

## 2011-11-30 DIAGNOSIS — I10 Essential (primary) hypertension: Secondary | ICD-10-CM | POA: Insufficient documentation

## 2011-12-02 ENCOUNTER — Encounter (HOSPITAL_COMMUNITY)
Admission: RE | Admit: 2011-12-02 | Discharge: 2011-12-02 | Disposition: A | Discharge status: Home / Self Care | Payer: Medicare Other | Source: Ambulatory Visit | Attending: Interventional Cardiology | Admitting: Interventional Cardiology

## 2011-12-05 ENCOUNTER — Encounter (HOSPITAL_COMMUNITY)
Admission: RE | Admit: 2011-12-05 | Discharge: 2011-12-05 | Disposition: A | Discharge status: Home / Self Care | Payer: Medicare Other | Source: Ambulatory Visit | Attending: Interventional Cardiology | Admitting: Interventional Cardiology

## 2011-12-07 ENCOUNTER — Encounter (HOSPITAL_COMMUNITY)
Admission: RE | Admit: 2011-12-07 | Discharge: 2011-12-07 | Disposition: A | Discharge status: Home / Self Care | Payer: Medicare Other | Source: Ambulatory Visit | Attending: Interventional Cardiology | Admitting: Interventional Cardiology

## 2011-12-09 ENCOUNTER — Encounter (HOSPITAL_COMMUNITY)
Admission: RE | Admit: 2011-12-09 | Discharge: 2011-12-09 | Disposition: A | Discharge status: Home / Self Care | Payer: Medicare Other | Source: Ambulatory Visit | Attending: Interventional Cardiology | Admitting: Interventional Cardiology

## 2011-12-12 ENCOUNTER — Encounter (HOSPITAL_COMMUNITY)
Admission: RE | Admit: 2011-12-12 | Discharge: 2011-12-12 | Disposition: A | Discharge status: Home / Self Care | Payer: Medicare Other | Source: Ambulatory Visit | Attending: Interventional Cardiology | Admitting: Interventional Cardiology

## 2011-12-14 ENCOUNTER — Encounter (HOSPITAL_COMMUNITY)
Admission: RE | Admit: 2011-12-14 | Discharge: 2011-12-14 | Disposition: A | Discharge status: Home / Self Care | Payer: Medicare Other | Source: Ambulatory Visit | Attending: Interventional Cardiology | Admitting: Interventional Cardiology

## 2011-12-16 ENCOUNTER — Encounter (HOSPITAL_COMMUNITY)
Admission: RE | Admit: 2011-12-16 | Discharge: 2011-12-16 | Disposition: A | Discharge status: Home / Self Care | Payer: Medicare Other | Source: Ambulatory Visit | Attending: Interventional Cardiology | Admitting: Interventional Cardiology

## 2011-12-19 ENCOUNTER — Encounter (HOSPITAL_COMMUNITY)
Admission: RE | Admit: 2011-12-19 | Discharge: 2011-12-19 | Disposition: A | Discharge status: Home / Self Care | Payer: Medicare Other | Source: Ambulatory Visit | Attending: Interventional Cardiology | Admitting: Interventional Cardiology

## 2011-12-20 ENCOUNTER — Encounter (HOSPITAL_BASED_OUTPATIENT_CLINIC_OR_DEPARTMENT_OTHER): Payer: Medicare Other | Attending: General Surgery

## 2011-12-20 DIAGNOSIS — X58XXXA Exposure to other specified factors, initial encounter: Secondary | ICD-10-CM | POA: Insufficient documentation

## 2011-12-20 DIAGNOSIS — S81009A Unspecified open wound, unspecified knee, initial encounter: Secondary | ICD-10-CM | POA: Insufficient documentation

## 2011-12-20 DIAGNOSIS — S81809A Unspecified open wound, unspecified lower leg, initial encounter: Secondary | ICD-10-CM | POA: Insufficient documentation

## 2011-12-20 NOTE — H&P (Signed)
NAMEHUNTLEY, Patrick Henson NO.:  [id number]  MEDICAL RECORD NO.:  [id number]  LOCATION:  REHS                         FACILITY:  [facility]  PHYSICIAN:  Joanne Gavel, M.D.        DATE OF BIRTH:  1940-07-01  DATE OF ADMISSION:  12/19/2011 DATE OF DISCHARGE:  12/19/2011                             HISTORY & PHYSICAL   CHIEF COMPLAINT:  Possibly traumatic wound of the right anterior leg.  HISTORY OF PRESENT ILLNESS:  The patient developed a small wound approximately 20 months ago.  He thinks there may have been minor trauma.  He is a type 2 diabetic and has been for many years.  PAST MEDICAL HISTORY:  He has had aortic stenosis, diabetes, hypertension, hyperlipidemia, detached retina, bilateral cataracts.  PAST SURGICAL HISTORY:  Aortic valve replacement, cataract surgery, left knee replacement, right hip replacement, appendectomy, tonsillectomy and hernia repair.  Cigarettes none.  Alcohol none.  MEDICATIONS:  Several vitamins, omega-3 fish oil, Atorvastatin, metformin, metoprolol.  ALLERGIES:  TRADJENTA and ACTOS.  REVIEW OF SYSTEMS:  As above.  PHYSICAL EXAMINATION:  VITAL SIGNS:  Temperature 98, pulse 60, respirations 18, blood pressure 154/78.  Glucose is 147. GENERAL:  Well developed, well nourished, no distress. HEENT:  Cranium normocephalic.  Eyes ears, nose, throat normal. CHEST:  Clear. HEART:  Regular rhythm. EXTREMITIES:  Examination of extremity reveals peripheral pulses palpable.  There is some swelling around 0.8 x 2.1 x 0.1 wound.  IMPRESSION:  Traumatic injury in the face of very minor venous stasis.  PLAN OF TREATMENT:  Soap and water washes, Fibracol every other day, Mepilex, elevation, see [place] in 7 days.     Joanne Gavel, M.D.     RA/MEDQ  D:  12/20/2011  T:  12/20/2011  Job:  [id number]

## 2011-12-21 ENCOUNTER — Encounter (HOSPITAL_COMMUNITY)
Admission: RE | Admit: 2011-12-21 | Discharge: 2011-12-21 | Disposition: A | Discharge status: Home / Self Care | Payer: Medicare Other | Source: Ambulatory Visit | Attending: Interventional Cardiology | Admitting: Interventional Cardiology

## 2011-12-23 ENCOUNTER — Encounter (HOSPITAL_COMMUNITY)
Admission: RE | Admit: 2011-12-23 | Discharge: 2011-12-23 | Disposition: A | Discharge status: Home / Self Care | Payer: Medicare Other | Source: Ambulatory Visit | Attending: Interventional Cardiology | Admitting: Interventional Cardiology

## 2011-12-26 ENCOUNTER — Encounter (HOSPITAL_COMMUNITY)
Admission: RE | Admit: 2011-12-26 | Discharge: 2011-12-26 | Disposition: A | Discharge status: Home / Self Care | Payer: Medicare Other | Source: Ambulatory Visit | Attending: Interventional Cardiology | Admitting: Interventional Cardiology

## 2011-12-27 NOTE — Progress Notes (Signed)
[facility]  NAME:  SHAKA, CARDIN                   ACCOUNT NO.:  [id number]  MEDICAL RECORD NO.:  [id number]      DATE OF BIRTH:  1941-02-05  PHYSICIAN:  Joanne Gavel, M.D.         VISIT DATE:  12/27/2011                                  OFFICE VISIT   CHIEF COMPLAINT:  Trauma wound in a patient with diabetes mellitus.  ADMITTING DATA:  This is a 71 year old male who hurt his leg 3 months prior to this admission.  He has had many treatments, but the wound has not healed.  CLINIC COURSE:  The wound was debrided of slough.  We treated him with Fibracol every other day and a wrap.  When we saw him today, the wound was completely healed.  DISCHARGE TREATMENT:  Follow up with his primary care doctor and a pressure support.  He is to see [place] p.r.n.     Joanne Gavel, M.D.     RA/MEDQ  D:  12/27/2011  T:  12/27/2011  Job:  [id number]

## 2011-12-28 ENCOUNTER — Encounter (HOSPITAL_COMMUNITY)
Admission: RE | Admit: 2011-12-28 | Discharge: 2011-12-28 | Disposition: A | Discharge status: Home / Self Care | Payer: Medicare Other | Source: Ambulatory Visit | Attending: Interventional Cardiology | Admitting: Interventional Cardiology

## 2011-12-30 ENCOUNTER — Encounter (HOSPITAL_COMMUNITY)
Admission: RE | Admit: 2011-12-30 | Discharge: 2011-12-30 | Disposition: A | Discharge status: Home / Self Care | Payer: Medicare Other | Source: Ambulatory Visit | Attending: Interventional Cardiology | Admitting: Interventional Cardiology

## 2011-12-30 DIAGNOSIS — G4733 Obstructive sleep apnea (adult) (pediatric): Secondary | ICD-10-CM | POA: Insufficient documentation

## 2011-12-30 DIAGNOSIS — E119 Type 2 diabetes mellitus without complications: Secondary | ICD-10-CM | POA: Insufficient documentation

## 2011-12-30 DIAGNOSIS — E785 Hyperlipidemia, unspecified: Secondary | ICD-10-CM | POA: Insufficient documentation

## 2011-12-30 DIAGNOSIS — Z5189 Encounter for other specified aftercare: Secondary | ICD-10-CM | POA: Insufficient documentation

## 2011-12-30 DIAGNOSIS — I10 Essential (primary) hypertension: Secondary | ICD-10-CM | POA: Insufficient documentation

## 2011-12-30 DIAGNOSIS — I359 Nonrheumatic aortic valve disorder, unspecified: Secondary | ICD-10-CM | POA: Insufficient documentation

## 2012-01-02 ENCOUNTER — Encounter (HOSPITAL_COMMUNITY)
Admission: RE | Admit: 2012-01-02 | Discharge: 2012-01-02 | Disposition: A | Discharge status: Home / Self Care | Payer: Medicare Other | Source: Ambulatory Visit | Attending: Interventional Cardiology | Admitting: Interventional Cardiology

## 2012-01-04 ENCOUNTER — Encounter (HOSPITAL_COMMUNITY)
Admission: RE | Admit: 2012-01-04 | Discharge: 2012-01-04 | Disposition: A | Discharge status: Home / Self Care | Payer: Medicare Other | Source: Ambulatory Visit | Attending: Interventional Cardiology | Admitting: Interventional Cardiology

## 2012-01-06 ENCOUNTER — Encounter (HOSPITAL_COMMUNITY)
Admission: RE | Admit: 2012-01-06 | Discharge: 2012-01-06 | Disposition: A | Discharge status: Home / Self Care | Payer: Medicare Other | Source: Ambulatory Visit | Attending: Interventional Cardiology | Admitting: Interventional Cardiology

## 2012-01-09 ENCOUNTER — Encounter (HOSPITAL_COMMUNITY)
Admission: RE | Admit: 2012-01-09 | Discharge: 2012-01-09 | Disposition: A | Discharge status: Home / Self Care | Payer: Medicare Other | Source: Ambulatory Visit | Attending: Interventional Cardiology | Admitting: Interventional Cardiology

## 2012-01-10 ENCOUNTER — Encounter (HOSPITAL_BASED_OUTPATIENT_CLINIC_OR_DEPARTMENT_OTHER): Payer: Medicare Other | Attending: General Surgery

## 2012-01-10 DIAGNOSIS — I872 Venous insufficiency (chronic) (peripheral): Secondary | ICD-10-CM | POA: Insufficient documentation

## 2012-01-10 DIAGNOSIS — L97809 Non-pressure chronic ulcer of other part of unspecified lower leg with unspecified severity: Secondary | ICD-10-CM | POA: Insufficient documentation

## 2012-01-10 DIAGNOSIS — L02419 Cutaneous abscess of limb, unspecified: Secondary | ICD-10-CM | POA: Insufficient documentation

## 2012-01-10 DIAGNOSIS — L03119 Cellulitis of unspecified part of limb: Secondary | ICD-10-CM | POA: Insufficient documentation

## 2012-01-11 ENCOUNTER — Encounter (HOSPITAL_COMMUNITY)
Admission: RE | Admit: 2012-01-11 | Discharge: 2012-01-11 | Disposition: A | Discharge status: Home / Self Care | Payer: Medicare Other | Source: Ambulatory Visit | Attending: Interventional Cardiology | Admitting: Interventional Cardiology

## 2012-01-13 ENCOUNTER — Encounter (HOSPITAL_COMMUNITY)
Admission: RE | Admit: 2012-01-13 | Discharge: 2012-01-13 | Disposition: A | Discharge status: Home / Self Care | Payer: Medicare Other | Source: Ambulatory Visit | Attending: Interventional Cardiology | Admitting: Interventional Cardiology

## 2012-01-16 ENCOUNTER — Encounter (HOSPITAL_COMMUNITY)
Admission: RE | Admit: 2012-01-16 | Discharge: 2012-01-16 | Disposition: A | Discharge status: Home / Self Care | Payer: Medicare Other | Source: Ambulatory Visit | Attending: Interventional Cardiology | Admitting: Interventional Cardiology

## 2012-01-18 ENCOUNTER — Encounter (HOSPITAL_COMMUNITY)
Admission: RE | Admit: 2012-01-18 | Discharge: 2012-01-18 | Disposition: A | Discharge status: Home / Self Care | Payer: Medicare Other | Source: Ambulatory Visit | Attending: Interventional Cardiology | Admitting: Interventional Cardiology

## 2012-01-18 NOTE — Progress Notes (Signed)
During the second station pt with elevated BP -    .  Pt switched to track instead of the bike.  After about 4 laps, pt observed to be sitting.  Pt questioned and reported that he felt "fuzzy" in the head.  Pt bp checked 130/60, Blood glucose 123.  Pt reports that he was running late this morning and did not get a chance to eat breakfast and he took all of his medications.  Pt given gatorade and peanut butter cracker to eat.   Pt reported that he felt much better and was able to resume exercise with the cool down portion of his exercise session.

## 2012-01-20 ENCOUNTER — Encounter (HOSPITAL_COMMUNITY)
Admission: RE | Admit: 2012-01-20 | Discharge: 2012-01-20 | Disposition: A | Discharge status: Home / Self Care | Payer: Medicare Other | Source: Ambulatory Visit | Attending: Interventional Cardiology | Admitting: Interventional Cardiology

## 2012-01-20 ENCOUNTER — Encounter (HOSPITAL_COMMUNITY): Payer: Self-pay

## 2012-01-20 NOTE — Progress Notes (Signed)
Pt graduated from cardiac rehab today.  Pt plans to exercise on his own at home riding stationary bike.  Medications reconciled.

## 2012-01-23 ENCOUNTER — Encounter (HOSPITAL_COMMUNITY): Payer: Medicare Other

## 2012-01-24 ENCOUNTER — Encounter (HOSPITAL_BASED_OUTPATIENT_CLINIC_OR_DEPARTMENT_OTHER): Payer: Medicare Other

## 2012-01-24 NOTE — Progress Notes (Signed)
[facility]  NAME:  Patrick Henson, Patrick Henson                        ACCOUNT NO.:  MEDICAL RECORD NO.:  [id number]      DATE OF BIRTH:  Jun 15, 1940  PHYSICIAN:  Joanne Gavel, M.D.         VISIT DATE:  01/24/2012                                  OFFICE VISIT   ADMITTING DIAGNOSIS:  Traumatic wound in an area of venous stasis.  DISCHARGE DIAGNOSIS:  Traumatic wound in an area of venous stasis.  TREATMENT COURSE:  He was treated with debridement and Fibracol.  The wound appeared to be healed on December 27, 2011, but recurrent superficially.  He was treated with Keflex what appeared to be cellulitis.  Collagen was applied to the wound, and on January 24, 2012, appeared to be completely healed.  DISCHARGE RECOMMENDATIONS:  Elevation, support hose, see [place] p.r.n.     Joanne Gavel, M.D.     RA/MEDQ  D:  01/24/2012  T:  01/24/2012  Job:  [id number]

## 2012-01-25 ENCOUNTER — Encounter (HOSPITAL_COMMUNITY): Payer: Medicare Other

## 2012-01-27 ENCOUNTER — Encounter (HOSPITAL_COMMUNITY): Payer: Medicare Other

## 2012-02-01 ENCOUNTER — Encounter (HOSPITAL_COMMUNITY): Payer: Medicare Other

## 2012-02-03 ENCOUNTER — Encounter (HOSPITAL_COMMUNITY): Payer: Medicare Other

## 2012-02-06 ENCOUNTER — Encounter (HOSPITAL_COMMUNITY): Payer: Medicare Other

## 2012-02-07 ENCOUNTER — Encounter (HOSPITAL_BASED_OUTPATIENT_CLINIC_OR_DEPARTMENT_OTHER): Payer: Medicare Other

## 2012-02-07 ENCOUNTER — Encounter (HOSPITAL_COMMUNITY)
Admission: RE | Admit: 2012-02-07 | Discharge: 2012-02-07 | Disposition: A | Discharge status: Home / Self Care | Payer: Self-pay | Source: Ambulatory Visit | Attending: Interventional Cardiology | Admitting: Interventional Cardiology

## 2012-02-07 DIAGNOSIS — I359 Nonrheumatic aortic valve disorder, unspecified: Secondary | ICD-10-CM | POA: Insufficient documentation

## 2012-02-07 DIAGNOSIS — G4733 Obstructive sleep apnea (adult) (pediatric): Secondary | ICD-10-CM | POA: Insufficient documentation

## 2012-02-07 DIAGNOSIS — I10 Essential (primary) hypertension: Secondary | ICD-10-CM | POA: Insufficient documentation

## 2012-02-07 DIAGNOSIS — E785 Hyperlipidemia, unspecified: Secondary | ICD-10-CM | POA: Insufficient documentation

## 2012-02-07 DIAGNOSIS — E119 Type 2 diabetes mellitus without complications: Secondary | ICD-10-CM | POA: Insufficient documentation

## 2012-02-07 DIAGNOSIS — Z5189 Encounter for other specified aftercare: Secondary | ICD-10-CM | POA: Insufficient documentation

## 2012-02-08 ENCOUNTER — Encounter (HOSPITAL_COMMUNITY): Payer: Medicare Other

## 2012-02-09 ENCOUNTER — Encounter (HOSPITAL_COMMUNITY)
Admission: RE | Admit: 2012-02-09 | Discharge: 2012-02-09 | Disposition: A | Discharge status: Home / Self Care | Payer: Self-pay | Source: Ambulatory Visit | Attending: Interventional Cardiology | Admitting: Interventional Cardiology

## 2012-02-10 ENCOUNTER — Encounter (HOSPITAL_COMMUNITY)
Admission: RE | Admit: 2012-02-10 | Discharge: 2012-02-10 | Disposition: A | Discharge status: Home / Self Care | Payer: Self-pay | Source: Ambulatory Visit | Attending: Interventional Cardiology | Admitting: Interventional Cardiology

## 2012-02-10 ENCOUNTER — Encounter (HOSPITAL_COMMUNITY): Payer: Medicare Other

## 2012-02-14 ENCOUNTER — Encounter (HOSPITAL_COMMUNITY)
Admission: RE | Admit: 2012-02-14 | Discharge: 2012-02-14 | Disposition: A | Discharge status: Home / Self Care | Payer: Self-pay | Source: Ambulatory Visit | Attending: Interventional Cardiology | Admitting: Interventional Cardiology

## 2012-02-16 ENCOUNTER — Encounter (HOSPITAL_COMMUNITY)
Admission: RE | Admit: 2012-02-16 | Discharge: 2012-02-16 | Disposition: A | Discharge status: Home / Self Care | Payer: Self-pay | Source: Ambulatory Visit | Attending: Interventional Cardiology | Admitting: Interventional Cardiology

## 2012-02-17 ENCOUNTER — Encounter (HOSPITAL_COMMUNITY)
Admission: RE | Admit: 2012-02-17 | Discharge: 2012-02-17 | Disposition: A | Discharge status: Home / Self Care | Payer: Self-pay | Source: Ambulatory Visit | Attending: Interventional Cardiology | Admitting: Interventional Cardiology

## 2012-02-21 ENCOUNTER — Encounter (HOSPITAL_COMMUNITY)
Admission: RE | Admit: 2012-02-21 | Discharge: 2012-02-21 | Disposition: A | Discharge status: Home / Self Care | Payer: Self-pay | Source: Ambulatory Visit | Attending: Interventional Cardiology | Admitting: Interventional Cardiology

## 2012-02-23 ENCOUNTER — Encounter (HOSPITAL_COMMUNITY)
Admission: RE | Admit: 2012-02-23 | Discharge: 2012-02-23 | Disposition: A | Discharge status: Home / Self Care | Payer: Self-pay | Source: Ambulatory Visit | Attending: Interventional Cardiology | Admitting: Interventional Cardiology

## 2012-02-24 ENCOUNTER — Encounter (HOSPITAL_COMMUNITY)
Admission: RE | Admit: 2012-02-24 | Discharge: 2012-02-24 | Disposition: A | Discharge status: Home / Self Care | Payer: Self-pay | Source: Ambulatory Visit | Attending: Interventional Cardiology | Admitting: Interventional Cardiology

## 2012-02-28 ENCOUNTER — Encounter (HOSPITAL_COMMUNITY): Payer: Self-pay

## 2012-02-28 DIAGNOSIS — I359 Nonrheumatic aortic valve disorder, unspecified: Secondary | ICD-10-CM | POA: Insufficient documentation

## 2012-02-28 DIAGNOSIS — I10 Essential (primary) hypertension: Secondary | ICD-10-CM | POA: Insufficient documentation

## 2012-02-28 DIAGNOSIS — G4733 Obstructive sleep apnea (adult) (pediatric): Secondary | ICD-10-CM | POA: Insufficient documentation

## 2012-02-28 DIAGNOSIS — Z5189 Encounter for other specified aftercare: Secondary | ICD-10-CM | POA: Insufficient documentation

## 2012-02-28 DIAGNOSIS — E119 Type 2 diabetes mellitus without complications: Secondary | ICD-10-CM | POA: Insufficient documentation

## 2012-02-28 DIAGNOSIS — E785 Hyperlipidemia, unspecified: Secondary | ICD-10-CM | POA: Insufficient documentation

## 2012-03-01 ENCOUNTER — Encounter (HOSPITAL_COMMUNITY)
Admission: RE | Admit: 2012-03-01 | Discharge: 2012-03-01 | Disposition: A | Discharge status: Home / Self Care | Payer: Self-pay | Source: Ambulatory Visit | Attending: Interventional Cardiology | Admitting: Interventional Cardiology

## 2012-03-02 ENCOUNTER — Encounter (HOSPITAL_COMMUNITY)
Admission: RE | Admit: 2012-03-02 | Discharge: 2012-03-02 | Disposition: A | Discharge status: Home / Self Care | Payer: Self-pay | Source: Ambulatory Visit | Attending: Interventional Cardiology | Admitting: Interventional Cardiology

## 2012-03-06 ENCOUNTER — Encounter (HOSPITAL_COMMUNITY)
Admission: RE | Admit: 2012-03-06 | Discharge: 2012-03-06 | Disposition: A | Discharge status: Home / Self Care | Payer: Self-pay | Source: Ambulatory Visit | Attending: Interventional Cardiology | Admitting: Interventional Cardiology

## 2012-03-08 ENCOUNTER — Encounter (HOSPITAL_COMMUNITY)
Admission: RE | Admit: 2012-03-08 | Discharge: 2012-03-08 | Disposition: A | Discharge status: Home / Self Care | Payer: Self-pay | Source: Ambulatory Visit | Attending: Interventional Cardiology | Admitting: Interventional Cardiology

## 2012-03-09 ENCOUNTER — Encounter (HOSPITAL_COMMUNITY)
Admission: RE | Admit: 2012-03-09 | Discharge: 2012-03-09 | Disposition: A | Discharge status: Home / Self Care | Payer: Self-pay | Source: Ambulatory Visit | Attending: Interventional Cardiology | Admitting: Interventional Cardiology

## 2012-03-13 ENCOUNTER — Encounter (HOSPITAL_COMMUNITY)
Admission: RE | Admit: 2012-03-13 | Discharge: 2012-03-13 | Disposition: A | Discharge status: Home / Self Care | Payer: Self-pay | Source: Ambulatory Visit | Attending: Interventional Cardiology | Admitting: Interventional Cardiology

## 2012-03-14 ENCOUNTER — Encounter (HOSPITAL_COMMUNITY)
Admission: RE | Admit: 2012-03-14 | Discharge: 2012-03-14 | Disposition: A | Discharge status: Home / Self Care | Payer: Self-pay | Source: Ambulatory Visit | Attending: Interventional Cardiology | Admitting: Interventional Cardiology

## 2012-03-15 ENCOUNTER — Encounter (HOSPITAL_COMMUNITY): Payer: Self-pay

## 2012-03-16 ENCOUNTER — Encounter (HOSPITAL_COMMUNITY): Payer: Self-pay

## 2012-03-20 ENCOUNTER — Encounter (HOSPITAL_COMMUNITY)
Admission: RE | Admit: 2012-03-20 | Discharge: 2012-03-20 | Disposition: A | Discharge status: Home / Self Care | Payer: Self-pay | Source: Ambulatory Visit | Attending: Interventional Cardiology | Admitting: Interventional Cardiology

## 2012-03-22 ENCOUNTER — Encounter (HOSPITAL_COMMUNITY)
Admission: RE | Admit: 2012-03-22 | Discharge: 2012-03-22 | Disposition: A | Discharge status: Home / Self Care | Payer: Self-pay | Source: Ambulatory Visit | Attending: Interventional Cardiology | Admitting: Interventional Cardiology

## 2012-03-23 ENCOUNTER — Encounter (HOSPITAL_COMMUNITY)
Admission: RE | Admit: 2012-03-23 | Discharge: 2012-03-23 | Disposition: A | Discharge status: Home / Self Care | Payer: Self-pay | Source: Ambulatory Visit | Attending: Interventional Cardiology | Admitting: Interventional Cardiology

## 2012-03-27 ENCOUNTER — Encounter (HOSPITAL_COMMUNITY)
Admission: RE | Admit: 2012-03-27 | Discharge: 2012-03-27 | Disposition: A | Discharge status: Home / Self Care | Payer: Self-pay | Source: Ambulatory Visit | Attending: Interventional Cardiology | Admitting: Interventional Cardiology

## 2012-03-28 ENCOUNTER — Encounter (HOSPITAL_COMMUNITY)
Admission: RE | Admit: 2012-03-28 | Discharge: 2012-03-28 | Disposition: A | Discharge status: Home / Self Care | Payer: Self-pay | Source: Ambulatory Visit | Attending: Interventional Cardiology | Admitting: Interventional Cardiology

## 2012-03-29 ENCOUNTER — Encounter (HOSPITAL_COMMUNITY)
Admission: RE | Admit: 2012-03-29 | Discharge: 2012-03-29 | Disposition: A | Discharge status: Home / Self Care | Payer: Self-pay | Source: Ambulatory Visit | Attending: Interventional Cardiology | Admitting: Interventional Cardiology

## 2012-03-30 ENCOUNTER — Encounter (HOSPITAL_COMMUNITY): Payer: Self-pay

## 2012-03-30 DIAGNOSIS — Z5189 Encounter for other specified aftercare: Secondary | ICD-10-CM | POA: Insufficient documentation

## 2012-03-30 DIAGNOSIS — E785 Hyperlipidemia, unspecified: Secondary | ICD-10-CM | POA: Insufficient documentation

## 2012-03-30 DIAGNOSIS — I359 Nonrheumatic aortic valve disorder, unspecified: Secondary | ICD-10-CM | POA: Insufficient documentation

## 2012-03-30 DIAGNOSIS — E119 Type 2 diabetes mellitus without complications: Secondary | ICD-10-CM | POA: Insufficient documentation

## 2012-03-30 DIAGNOSIS — G4733 Obstructive sleep apnea (adult) (pediatric): Secondary | ICD-10-CM | POA: Insufficient documentation

## 2012-03-30 DIAGNOSIS — I10 Essential (primary) hypertension: Secondary | ICD-10-CM | POA: Insufficient documentation

## 2012-04-03 ENCOUNTER — Encounter (HOSPITAL_COMMUNITY)
Admission: RE | Admit: 2012-04-03 | Discharge: 2012-04-03 | Disposition: A | Discharge status: Home / Self Care | Payer: Self-pay | Source: Ambulatory Visit | Attending: Interventional Cardiology | Admitting: Interventional Cardiology

## 2012-04-04 ENCOUNTER — Encounter (HOSPITAL_COMMUNITY)
Admission: RE | Admit: 2012-04-04 | Discharge: 2012-04-04 | Disposition: A | Discharge status: Home / Self Care | Payer: Self-pay | Source: Ambulatory Visit | Attending: Interventional Cardiology | Admitting: Interventional Cardiology

## 2012-04-05 ENCOUNTER — Encounter (HOSPITAL_COMMUNITY): Payer: Self-pay

## 2012-04-06 ENCOUNTER — Encounter (HOSPITAL_COMMUNITY)
Admission: RE | Admit: 2012-04-06 | Discharge: 2012-04-06 | Disposition: A | Discharge status: Home / Self Care | Payer: Self-pay | Source: Ambulatory Visit | Attending: Interventional Cardiology | Admitting: Interventional Cardiology

## 2012-04-10 ENCOUNTER — Encounter (HOSPITAL_COMMUNITY)
Admission: RE | Admit: 2012-04-10 | Discharge: 2012-04-10 | Disposition: A | Discharge status: Home / Self Care | Payer: Self-pay | Source: Ambulatory Visit | Attending: Interventional Cardiology | Admitting: Interventional Cardiology

## 2012-04-11 ENCOUNTER — Encounter (HOSPITAL_COMMUNITY)
Admission: RE | Admit: 2012-04-11 | Discharge: 2012-04-11 | Disposition: A | Discharge status: Home / Self Care | Payer: Self-pay | Source: Ambulatory Visit | Attending: Interventional Cardiology | Admitting: Interventional Cardiology

## 2012-04-12 ENCOUNTER — Encounter (HOSPITAL_COMMUNITY): Payer: Self-pay

## 2012-04-13 ENCOUNTER — Encounter (HOSPITAL_COMMUNITY)
Admission: RE | Admit: 2012-04-13 | Discharge: 2012-04-13 | Disposition: A | Discharge status: Home / Self Care | Payer: Self-pay | Source: Ambulatory Visit | Attending: Interventional Cardiology | Admitting: Interventional Cardiology

## 2012-04-17 ENCOUNTER — Encounter (HOSPITAL_COMMUNITY)
Admission: RE | Admit: 2012-04-17 | Discharge: 2012-04-17 | Disposition: A | Discharge status: Home / Self Care | Payer: Self-pay | Source: Ambulatory Visit | Attending: Interventional Cardiology | Admitting: Interventional Cardiology

## 2012-04-19 ENCOUNTER — Encounter (HOSPITAL_COMMUNITY)
Admission: RE | Admit: 2012-04-19 | Discharge: 2012-04-19 | Disposition: A | Discharge status: Home / Self Care | Payer: Self-pay | Source: Ambulatory Visit | Attending: Interventional Cardiology | Admitting: Interventional Cardiology

## 2012-04-20 ENCOUNTER — Encounter (HOSPITAL_COMMUNITY)
Admission: RE | Admit: 2012-04-20 | Discharge: 2012-04-20 | Disposition: A | Discharge status: Home / Self Care | Payer: Self-pay | Source: Ambulatory Visit | Attending: Interventional Cardiology | Admitting: Interventional Cardiology

## 2012-04-24 ENCOUNTER — Encounter (HOSPITAL_COMMUNITY)
Admission: RE | Admit: 2012-04-24 | Discharge: 2012-04-24 | Disposition: A | Discharge status: Home / Self Care | Payer: Self-pay | Source: Ambulatory Visit | Attending: Interventional Cardiology | Admitting: Interventional Cardiology

## 2012-05-01 ENCOUNTER — Encounter (HOSPITAL_COMMUNITY)
Admission: RE | Admit: 2012-05-01 | Discharge: 2012-05-01 | Disposition: A | Discharge status: Home / Self Care | Payer: Self-pay | Source: Ambulatory Visit | Attending: Interventional Cardiology | Admitting: Interventional Cardiology

## 2012-05-01 DIAGNOSIS — E119 Type 2 diabetes mellitus without complications: Secondary | ICD-10-CM | POA: Insufficient documentation

## 2012-05-01 DIAGNOSIS — I10 Essential (primary) hypertension: Secondary | ICD-10-CM | POA: Insufficient documentation

## 2012-05-01 DIAGNOSIS — I359 Nonrheumatic aortic valve disorder, unspecified: Secondary | ICD-10-CM | POA: Insufficient documentation

## 2012-05-01 DIAGNOSIS — G4733 Obstructive sleep apnea (adult) (pediatric): Secondary | ICD-10-CM | POA: Insufficient documentation

## 2012-05-01 DIAGNOSIS — Z5189 Encounter for other specified aftercare: Secondary | ICD-10-CM | POA: Insufficient documentation

## 2012-05-01 DIAGNOSIS — E785 Hyperlipidemia, unspecified: Secondary | ICD-10-CM | POA: Insufficient documentation

## 2012-05-03 ENCOUNTER — Encounter (HOSPITAL_COMMUNITY)
Admission: RE | Admit: 2012-05-03 | Discharge: 2012-05-03 | Disposition: A | Discharge status: Home / Self Care | Payer: Self-pay | Source: Ambulatory Visit | Attending: Interventional Cardiology | Admitting: Interventional Cardiology

## 2012-05-04 ENCOUNTER — Encounter (HOSPITAL_COMMUNITY)
Admission: RE | Admit: 2012-05-04 | Discharge: 2012-05-04 | Disposition: A | Discharge status: Home / Self Care | Payer: Self-pay | Source: Ambulatory Visit | Attending: Interventional Cardiology | Admitting: Interventional Cardiology

## 2012-05-08 ENCOUNTER — Encounter (HOSPITAL_COMMUNITY)
Admission: RE | Admit: 2012-05-08 | Discharge: 2012-05-08 | Disposition: A | Discharge status: Home / Self Care | Payer: Self-pay | Source: Ambulatory Visit | Attending: Interventional Cardiology | Admitting: Interventional Cardiology

## 2012-05-10 ENCOUNTER — Encounter (HOSPITAL_COMMUNITY)
Admission: RE | Admit: 2012-05-10 | Discharge: 2012-05-10 | Disposition: A | Discharge status: Home / Self Care | Payer: Self-pay | Source: Ambulatory Visit | Attending: Interventional Cardiology | Admitting: Interventional Cardiology

## 2012-05-11 ENCOUNTER — Encounter (HOSPITAL_COMMUNITY): Payer: Self-pay

## 2012-05-15 ENCOUNTER — Encounter (HOSPITAL_COMMUNITY): Payer: Self-pay

## 2012-05-17 ENCOUNTER — Encounter (HOSPITAL_COMMUNITY)
Admission: RE | Admit: 2012-05-17 | Discharge: 2012-05-17 | Disposition: A | Discharge status: Home / Self Care | Payer: Self-pay | Source: Ambulatory Visit | Attending: Interventional Cardiology | Admitting: Interventional Cardiology

## 2012-05-18 ENCOUNTER — Encounter (HOSPITAL_COMMUNITY): Payer: Self-pay

## 2012-05-22 ENCOUNTER — Encounter (HOSPITAL_COMMUNITY): Payer: Self-pay

## 2012-05-24 ENCOUNTER — Encounter (HOSPITAL_COMMUNITY)
Admission: RE | Admit: 2012-05-24 | Discharge: 2012-05-24 | Disposition: A | Discharge status: Home / Self Care | Payer: Self-pay | Source: Ambulatory Visit | Attending: Interventional Cardiology | Admitting: Interventional Cardiology

## 2012-05-25 ENCOUNTER — Encounter (HOSPITAL_COMMUNITY): Payer: Self-pay

## 2012-05-29 ENCOUNTER — Encounter (HOSPITAL_COMMUNITY): Payer: Self-pay

## 2012-05-31 ENCOUNTER — Encounter (HOSPITAL_COMMUNITY)
Admission: RE | Admit: 2012-05-31 | Discharge: 2012-05-31 | Disposition: A | Discharge status: Home / Self Care | Payer: Self-pay | Source: Ambulatory Visit | Attending: Interventional Cardiology | Admitting: Interventional Cardiology

## 2012-05-31 DIAGNOSIS — I10 Essential (primary) hypertension: Secondary | ICD-10-CM | POA: Insufficient documentation

## 2012-05-31 DIAGNOSIS — E119 Type 2 diabetes mellitus without complications: Secondary | ICD-10-CM | POA: Insufficient documentation

## 2012-05-31 DIAGNOSIS — I359 Nonrheumatic aortic valve disorder, unspecified: Secondary | ICD-10-CM | POA: Insufficient documentation

## 2012-05-31 DIAGNOSIS — G4733 Obstructive sleep apnea (adult) (pediatric): Secondary | ICD-10-CM | POA: Insufficient documentation

## 2012-05-31 DIAGNOSIS — E785 Hyperlipidemia, unspecified: Secondary | ICD-10-CM | POA: Insufficient documentation

## 2012-05-31 DIAGNOSIS — Z5189 Encounter for other specified aftercare: Secondary | ICD-10-CM | POA: Insufficient documentation

## 2012-06-01 ENCOUNTER — Encounter (HOSPITAL_COMMUNITY): Payer: Self-pay

## 2012-06-05 ENCOUNTER — Encounter (HOSPITAL_COMMUNITY): Payer: Self-pay

## 2012-06-06 ENCOUNTER — Encounter (HOSPITAL_COMMUNITY)
Admission: RE | Admit: 2012-06-06 | Discharge: 2012-06-06 | Disposition: A | Discharge status: Home / Self Care | Payer: Self-pay | Source: Ambulatory Visit | Attending: Interventional Cardiology | Admitting: Interventional Cardiology

## 2012-06-07 ENCOUNTER — Encounter (HOSPITAL_COMMUNITY)
Admission: RE | Admit: 2012-06-07 | Discharge: 2012-06-07 | Disposition: A | Discharge status: Home / Self Care | Payer: Self-pay | Source: Ambulatory Visit | Attending: Interventional Cardiology | Admitting: Interventional Cardiology

## 2012-06-08 ENCOUNTER — Encounter (HOSPITAL_COMMUNITY)
Admission: RE | Admit: 2012-06-08 | Discharge: 2012-06-08 | Disposition: A | Discharge status: Home / Self Care | Payer: Self-pay | Source: Ambulatory Visit | Attending: Interventional Cardiology | Admitting: Interventional Cardiology

## 2012-06-12 ENCOUNTER — Encounter (HOSPITAL_COMMUNITY): Payer: Self-pay

## 2012-06-14 ENCOUNTER — Encounter (HOSPITAL_COMMUNITY): Payer: Self-pay

## 2012-06-15 ENCOUNTER — Encounter (HOSPITAL_COMMUNITY): Payer: Self-pay

## 2012-06-19 ENCOUNTER — Encounter (HOSPITAL_COMMUNITY): Payer: Self-pay

## 2012-06-21 ENCOUNTER — Encounter (HOSPITAL_COMMUNITY): Payer: Self-pay

## 2012-06-22 ENCOUNTER — Encounter (HOSPITAL_COMMUNITY)
Admission: RE | Admit: 2012-06-22 | Discharge: 2012-06-22 | Disposition: A | Discharge status: Home / Self Care | Payer: Self-pay | Source: Ambulatory Visit | Attending: Interventional Cardiology | Admitting: Interventional Cardiology

## 2012-06-26 ENCOUNTER — Encounter (HOSPITAL_COMMUNITY)
Admission: RE | Admit: 2012-06-26 | Discharge: 2012-06-26 | Disposition: A | Discharge status: Home / Self Care | Payer: Self-pay | Source: Ambulatory Visit | Attending: Interventional Cardiology | Admitting: Interventional Cardiology

## 2012-06-28 ENCOUNTER — Encounter (HOSPITAL_COMMUNITY): Payer: Self-pay

## 2012-06-29 ENCOUNTER — Encounter (HOSPITAL_COMMUNITY): Payer: Self-pay

## 2012-07-03 ENCOUNTER — Encounter (HOSPITAL_COMMUNITY): Payer: Self-pay

## 2012-07-03 DIAGNOSIS — I10 Essential (primary) hypertension: Secondary | ICD-10-CM | POA: Insufficient documentation

## 2012-07-03 DIAGNOSIS — E785 Hyperlipidemia, unspecified: Secondary | ICD-10-CM | POA: Insufficient documentation

## 2012-07-03 DIAGNOSIS — E119 Type 2 diabetes mellitus without complications: Secondary | ICD-10-CM | POA: Insufficient documentation

## 2012-07-03 DIAGNOSIS — Z5189 Encounter for other specified aftercare: Secondary | ICD-10-CM | POA: Insufficient documentation

## 2012-07-03 DIAGNOSIS — G4733 Obstructive sleep apnea (adult) (pediatric): Secondary | ICD-10-CM | POA: Insufficient documentation

## 2012-07-03 DIAGNOSIS — I359 Nonrheumatic aortic valve disorder, unspecified: Secondary | ICD-10-CM | POA: Insufficient documentation

## 2012-07-05 ENCOUNTER — Encounter (HOSPITAL_COMMUNITY): Payer: Self-pay

## 2012-07-06 ENCOUNTER — Encounter (HOSPITAL_COMMUNITY): Payer: Self-pay

## 2012-07-10 ENCOUNTER — Encounter (HOSPITAL_COMMUNITY): Payer: Self-pay

## 2012-07-12 ENCOUNTER — Encounter (HOSPITAL_COMMUNITY): Payer: Self-pay

## 2012-07-13 ENCOUNTER — Encounter (HOSPITAL_COMMUNITY): Payer: Self-pay

## 2012-07-17 ENCOUNTER — Encounter (HOSPITAL_COMMUNITY)
Admission: RE | Admit: 2012-07-17 | Discharge: 2012-07-17 | Disposition: A | Discharge status: Home / Self Care | Payer: Self-pay | Source: Ambulatory Visit | Attending: Interventional Cardiology | Admitting: Interventional Cardiology

## 2012-07-19 ENCOUNTER — Encounter (HOSPITAL_COMMUNITY)
Admission: RE | Admit: 2012-07-19 | Discharge: 2012-07-19 | Disposition: A | Discharge status: Home / Self Care | Payer: Self-pay | Source: Ambulatory Visit | Attending: Interventional Cardiology | Admitting: Interventional Cardiology

## 2012-07-20 ENCOUNTER — Encounter (HOSPITAL_COMMUNITY)
Admission: RE | Admit: 2012-07-20 | Discharge: 2012-07-20 | Disposition: A | Discharge status: Home / Self Care | Payer: Self-pay | Source: Ambulatory Visit | Attending: Interventional Cardiology | Admitting: Interventional Cardiology

## 2012-07-24 ENCOUNTER — Encounter (HOSPITAL_COMMUNITY): Payer: Self-pay

## 2012-07-26 ENCOUNTER — Encounter (HOSPITAL_COMMUNITY): Payer: Self-pay

## 2012-07-27 ENCOUNTER — Encounter (HOSPITAL_COMMUNITY): Payer: Self-pay

## 2012-07-31 ENCOUNTER — Encounter (HOSPITAL_COMMUNITY): Payer: Self-pay

## 2012-07-31 DIAGNOSIS — I10 Essential (primary) hypertension: Secondary | ICD-10-CM | POA: Insufficient documentation

## 2012-07-31 DIAGNOSIS — Z5189 Encounter for other specified aftercare: Secondary | ICD-10-CM | POA: Insufficient documentation

## 2012-07-31 DIAGNOSIS — G4733 Obstructive sleep apnea (adult) (pediatric): Secondary | ICD-10-CM | POA: Insufficient documentation

## 2012-07-31 DIAGNOSIS — E785 Hyperlipidemia, unspecified: Secondary | ICD-10-CM | POA: Insufficient documentation

## 2012-07-31 DIAGNOSIS — E119 Type 2 diabetes mellitus without complications: Secondary | ICD-10-CM | POA: Insufficient documentation

## 2012-07-31 DIAGNOSIS — I359 Nonrheumatic aortic valve disorder, unspecified: Secondary | ICD-10-CM | POA: Insufficient documentation

## 2012-08-02 ENCOUNTER — Encounter (HOSPITAL_COMMUNITY): Payer: Self-pay

## 2012-08-03 ENCOUNTER — Encounter (HOSPITAL_COMMUNITY): Payer: Self-pay

## 2012-08-07 ENCOUNTER — Encounter (HOSPITAL_COMMUNITY): Payer: Self-pay

## 2012-08-08 ENCOUNTER — Encounter (HOSPITAL_COMMUNITY)
Admission: RE | Admit: 2012-08-08 | Discharge: 2012-08-08 | Disposition: A | Discharge status: Home / Self Care | Payer: Self-pay | Source: Ambulatory Visit | Attending: Interventional Cardiology | Admitting: Interventional Cardiology

## 2012-08-09 ENCOUNTER — Encounter (HOSPITAL_COMMUNITY): Payer: Self-pay

## 2012-08-10 ENCOUNTER — Encounter (HOSPITAL_COMMUNITY): Payer: Self-pay

## 2012-08-14 ENCOUNTER — Encounter (HOSPITAL_COMMUNITY): Payer: Self-pay

## 2012-08-16 ENCOUNTER — Encounter (HOSPITAL_COMMUNITY): Payer: Self-pay

## 2012-08-17 ENCOUNTER — Encounter (HOSPITAL_COMMUNITY): Payer: Self-pay

## 2012-08-21 ENCOUNTER — Encounter (HOSPITAL_COMMUNITY)
Admission: RE | Admit: 2012-08-21 | Discharge: 2012-08-21 | Disposition: A | Discharge status: Home / Self Care | Payer: Self-pay | Source: Ambulatory Visit | Attending: Interventional Cardiology | Admitting: Interventional Cardiology

## 2012-08-23 ENCOUNTER — Encounter (HOSPITAL_COMMUNITY): Payer: Self-pay

## 2012-08-24 ENCOUNTER — Encounter (HOSPITAL_COMMUNITY): Payer: Self-pay

## 2012-08-28 ENCOUNTER — Encounter (HOSPITAL_COMMUNITY): Payer: Self-pay

## 2012-08-28 DIAGNOSIS — E119 Type 2 diabetes mellitus without complications: Secondary | ICD-10-CM | POA: Insufficient documentation

## 2012-08-28 DIAGNOSIS — I10 Essential (primary) hypertension: Secondary | ICD-10-CM | POA: Insufficient documentation

## 2012-08-28 DIAGNOSIS — I359 Nonrheumatic aortic valve disorder, unspecified: Secondary | ICD-10-CM | POA: Insufficient documentation

## 2012-08-28 DIAGNOSIS — G4733 Obstructive sleep apnea (adult) (pediatric): Secondary | ICD-10-CM | POA: Insufficient documentation

## 2012-08-28 DIAGNOSIS — E785 Hyperlipidemia, unspecified: Secondary | ICD-10-CM | POA: Insufficient documentation

## 2012-08-28 DIAGNOSIS — Z5189 Encounter for other specified aftercare: Secondary | ICD-10-CM | POA: Insufficient documentation

## 2012-08-29 ENCOUNTER — Encounter (HOSPITAL_COMMUNITY)
Admission: RE | Admit: 2012-08-29 | Discharge: 2012-08-29 | Disposition: A | Discharge status: Home / Self Care | Payer: Self-pay | Source: Ambulatory Visit | Attending: Interventional Cardiology | Admitting: Interventional Cardiology

## 2012-08-30 ENCOUNTER — Encounter (HOSPITAL_COMMUNITY)
Admission: RE | Admit: 2012-08-30 | Discharge: 2012-08-30 | Disposition: A | Discharge status: Home / Self Care | Payer: Self-pay | Source: Ambulatory Visit | Attending: Interventional Cardiology | Admitting: Interventional Cardiology

## 2012-08-31 ENCOUNTER — Encounter (HOSPITAL_COMMUNITY): Payer: Self-pay

## 2012-09-04 ENCOUNTER — Encounter (HOSPITAL_COMMUNITY): Payer: Self-pay

## 2012-09-06 ENCOUNTER — Encounter (HOSPITAL_COMMUNITY): Payer: Self-pay

## 2012-09-07 ENCOUNTER — Encounter (HOSPITAL_COMMUNITY): Payer: Self-pay

## 2012-09-11 ENCOUNTER — Encounter (HOSPITAL_COMMUNITY)
Admission: RE | Admit: 2012-09-11 | Discharge: 2012-09-11 | Disposition: A | Discharge status: Home / Self Care | Payer: Self-pay | Source: Ambulatory Visit | Attending: Interventional Cardiology | Admitting: Interventional Cardiology

## 2012-09-13 ENCOUNTER — Encounter (HOSPITAL_COMMUNITY)
Admission: RE | Admit: 2012-09-13 | Discharge: 2012-09-13 | Disposition: A | Discharge status: Home / Self Care | Payer: Self-pay | Source: Ambulatory Visit | Attending: Interventional Cardiology | Admitting: Interventional Cardiology

## 2012-09-14 ENCOUNTER — Encounter (HOSPITAL_COMMUNITY)
Admission: RE | Admit: 2012-09-14 | Discharge: 2012-09-14 | Disposition: A | Discharge status: Home / Self Care | Payer: Self-pay | Source: Ambulatory Visit | Attending: Interventional Cardiology | Admitting: Interventional Cardiology

## 2012-09-18 ENCOUNTER — Encounter (HOSPITAL_COMMUNITY)
Admission: RE | Admit: 2012-09-18 | Discharge: 2012-09-18 | Disposition: A | Discharge status: Home / Self Care | Payer: Self-pay | Source: Ambulatory Visit | Attending: Interventional Cardiology | Admitting: Interventional Cardiology

## 2012-09-20 ENCOUNTER — Encounter (HOSPITAL_COMMUNITY)
Admission: RE | Admit: 2012-09-20 | Discharge: 2012-09-20 | Disposition: A | Discharge status: Home / Self Care | Payer: Self-pay | Source: Ambulatory Visit | Attending: Interventional Cardiology | Admitting: Interventional Cardiology

## 2012-09-21 ENCOUNTER — Encounter (HOSPITAL_COMMUNITY): Payer: Self-pay

## 2012-09-25 ENCOUNTER — Encounter (HOSPITAL_COMMUNITY): Payer: Self-pay

## 2012-09-27 ENCOUNTER — Encounter (HOSPITAL_COMMUNITY): Payer: Self-pay

## 2012-09-27 DIAGNOSIS — G4733 Obstructive sleep apnea (adult) (pediatric): Secondary | ICD-10-CM | POA: Insufficient documentation

## 2012-09-27 DIAGNOSIS — E785 Hyperlipidemia, unspecified: Secondary | ICD-10-CM | POA: Insufficient documentation

## 2012-09-27 DIAGNOSIS — I359 Nonrheumatic aortic valve disorder, unspecified: Secondary | ICD-10-CM | POA: Insufficient documentation

## 2012-09-27 DIAGNOSIS — I10 Essential (primary) hypertension: Secondary | ICD-10-CM | POA: Insufficient documentation

## 2012-09-27 DIAGNOSIS — Z5189 Encounter for other specified aftercare: Secondary | ICD-10-CM | POA: Insufficient documentation

## 2012-09-27 DIAGNOSIS — E119 Type 2 diabetes mellitus without complications: Secondary | ICD-10-CM | POA: Insufficient documentation

## 2012-09-28 ENCOUNTER — Encounter (HOSPITAL_COMMUNITY): Payer: Self-pay

## 2012-10-02 ENCOUNTER — Encounter (HOSPITAL_COMMUNITY)
Admission: RE | Admit: 2012-10-02 | Discharge: 2012-10-02 | Disposition: A | Discharge status: Home / Self Care | Payer: Self-pay | Source: Ambulatory Visit | Attending: Interventional Cardiology | Admitting: Interventional Cardiology

## 2012-10-04 ENCOUNTER — Encounter (HOSPITAL_COMMUNITY)
Admission: RE | Admit: 2012-10-04 | Discharge: 2012-10-04 | Disposition: A | Discharge status: Home / Self Care | Payer: Self-pay | Source: Ambulatory Visit | Attending: Interventional Cardiology | Admitting: Interventional Cardiology

## 2012-10-05 ENCOUNTER — Encounter (HOSPITAL_COMMUNITY)
Admission: RE | Admit: 2012-10-05 | Discharge: 2012-10-05 | Disposition: A | Discharge status: Home / Self Care | Payer: Self-pay | Source: Ambulatory Visit | Attending: Interventional Cardiology | Admitting: Interventional Cardiology

## 2012-10-09 ENCOUNTER — Encounter (HOSPITAL_COMMUNITY)
Admission: RE | Admit: 2012-10-09 | Discharge: 2012-10-09 | Disposition: A | Discharge status: Home / Self Care | Payer: Self-pay | Source: Ambulatory Visit | Attending: Interventional Cardiology | Admitting: Interventional Cardiology

## 2012-10-11 ENCOUNTER — Encounter (HOSPITAL_COMMUNITY)
Admission: RE | Admit: 2012-10-11 | Discharge: 2012-10-11 | Disposition: A | Discharge status: Home / Self Care | Payer: Self-pay | Source: Ambulatory Visit | Attending: Interventional Cardiology | Admitting: Interventional Cardiology

## 2012-10-12 ENCOUNTER — Encounter (HOSPITAL_COMMUNITY): Payer: Self-pay

## 2012-10-16 ENCOUNTER — Encounter (HOSPITAL_COMMUNITY)
Admission: RE | Admit: 2012-10-16 | Discharge: 2012-10-16 | Disposition: A | Discharge status: Home / Self Care | Payer: Self-pay | Source: Ambulatory Visit | Attending: Interventional Cardiology | Admitting: Interventional Cardiology

## 2012-10-16 NOTE — Progress Notes (Signed)
Pt at cardiac rehab maintenance c/o numbness in left leg, persistent since yesterday.  Pt reports he sat more than usual yesterday which precipitated the numbness.  No pain, weakness or tingling present.  Pt denies symptoms elsewhere.  Pt exercised at cardiac rehab prior to reporting the symptom.  Pt reports he was able to exercise without difficulty except with walking which was more difficult than usual.  appt scheduled with Dr. Madelin Rear today at 1030am.

## 2012-10-18 ENCOUNTER — Encounter (HOSPITAL_COMMUNITY): Payer: Self-pay

## 2012-10-19 ENCOUNTER — Encounter (HOSPITAL_COMMUNITY): Payer: Self-pay

## 2012-10-23 ENCOUNTER — Encounter (HOSPITAL_COMMUNITY): Payer: Self-pay

## 2012-10-25 ENCOUNTER — Encounter (HOSPITAL_COMMUNITY): Payer: Self-pay

## 2012-10-26 ENCOUNTER — Encounter (HOSPITAL_COMMUNITY): Payer: Self-pay

## 2012-10-30 ENCOUNTER — Encounter (HOSPITAL_COMMUNITY)
Admission: RE | Admit: 2012-10-30 | Discharge: 2012-10-30 | Disposition: A | Discharge status: Home / Self Care | Payer: Self-pay | Source: Ambulatory Visit | Attending: Interventional Cardiology | Admitting: Interventional Cardiology

## 2012-10-30 DIAGNOSIS — I359 Nonrheumatic aortic valve disorder, unspecified: Secondary | ICD-10-CM | POA: Insufficient documentation

## 2012-10-30 DIAGNOSIS — I10 Essential (primary) hypertension: Secondary | ICD-10-CM | POA: Insufficient documentation

## 2012-10-30 DIAGNOSIS — G4733 Obstructive sleep apnea (adult) (pediatric): Secondary | ICD-10-CM | POA: Insufficient documentation

## 2012-10-30 DIAGNOSIS — Z5189 Encounter for other specified aftercare: Secondary | ICD-10-CM | POA: Insufficient documentation

## 2012-10-30 DIAGNOSIS — E785 Hyperlipidemia, unspecified: Secondary | ICD-10-CM | POA: Insufficient documentation

## 2012-10-30 DIAGNOSIS — E119 Type 2 diabetes mellitus without complications: Secondary | ICD-10-CM | POA: Insufficient documentation

## 2012-11-01 ENCOUNTER — Encounter (HOSPITAL_COMMUNITY): Payer: Self-pay

## 2012-11-02 ENCOUNTER — Encounter (HOSPITAL_COMMUNITY): Payer: Self-pay

## 2012-11-06 ENCOUNTER — Encounter (HOSPITAL_COMMUNITY)
Admission: RE | Admit: 2012-11-06 | Discharge: 2012-11-06 | Disposition: A | Discharge status: Home / Self Care | Payer: Self-pay | Source: Ambulatory Visit | Attending: Interventional Cardiology | Admitting: Interventional Cardiology

## 2012-11-08 ENCOUNTER — Encounter (HOSPITAL_COMMUNITY): Payer: Self-pay

## 2012-11-09 ENCOUNTER — Encounter (HOSPITAL_COMMUNITY)
Admission: RE | Admit: 2012-11-09 | Discharge: 2012-11-09 | Disposition: A | Discharge status: Home / Self Care | Payer: Self-pay | Source: Ambulatory Visit | Attending: Interventional Cardiology | Admitting: Interventional Cardiology

## 2012-11-13 ENCOUNTER — Encounter (HOSPITAL_COMMUNITY)
Admission: RE | Admit: 2012-11-13 | Discharge: 2012-11-13 | Disposition: A | Discharge status: Home / Self Care | Payer: Self-pay | Source: Ambulatory Visit | Attending: Interventional Cardiology | Admitting: Interventional Cardiology

## 2012-11-15 ENCOUNTER — Encounter (HOSPITAL_COMMUNITY)
Admission: RE | Admit: 2012-11-15 | Discharge: 2012-11-15 | Disposition: A | Discharge status: Home / Self Care | Payer: Self-pay | Source: Ambulatory Visit | Attending: Interventional Cardiology | Admitting: Interventional Cardiology

## 2012-11-16 ENCOUNTER — Encounter (HOSPITAL_COMMUNITY): Payer: Self-pay

## 2012-11-20 ENCOUNTER — Encounter (HOSPITAL_COMMUNITY)
Admission: RE | Admit: 2012-11-20 | Discharge: 2012-11-20 | Disposition: A | Discharge status: Home / Self Care | Payer: Self-pay | Source: Ambulatory Visit | Attending: Interventional Cardiology | Admitting: Interventional Cardiology

## 2012-11-22 ENCOUNTER — Encounter (HOSPITAL_COMMUNITY)
Admission: RE | Admit: 2012-11-22 | Discharge: 2012-11-22 | Disposition: A | Discharge status: Home / Self Care | Payer: Self-pay | Source: Ambulatory Visit | Attending: Interventional Cardiology | Admitting: Interventional Cardiology

## 2012-11-23 ENCOUNTER — Encounter (HOSPITAL_COMMUNITY)
Admission: RE | Admit: 2012-11-23 | Discharge: 2012-11-23 | Disposition: A | Discharge status: Home / Self Care | Payer: Self-pay | Source: Ambulatory Visit | Attending: Interventional Cardiology | Admitting: Interventional Cardiology

## 2012-11-27 ENCOUNTER — Encounter (HOSPITAL_COMMUNITY)
Admission: RE | Admit: 2012-11-27 | Discharge: 2012-11-27 | Disposition: A | Discharge status: Home / Self Care | Payer: Self-pay | Source: Ambulatory Visit | Attending: Interventional Cardiology | Admitting: Interventional Cardiology

## 2012-11-27 DIAGNOSIS — I10 Essential (primary) hypertension: Secondary | ICD-10-CM | POA: Insufficient documentation

## 2012-11-27 DIAGNOSIS — E119 Type 2 diabetes mellitus without complications: Secondary | ICD-10-CM | POA: Insufficient documentation

## 2012-11-27 DIAGNOSIS — I359 Nonrheumatic aortic valve disorder, unspecified: Secondary | ICD-10-CM | POA: Insufficient documentation

## 2012-11-27 DIAGNOSIS — Z5189 Encounter for other specified aftercare: Secondary | ICD-10-CM | POA: Insufficient documentation

## 2012-11-27 DIAGNOSIS — G4733 Obstructive sleep apnea (adult) (pediatric): Secondary | ICD-10-CM | POA: Insufficient documentation

## 2012-11-27 DIAGNOSIS — E785 Hyperlipidemia, unspecified: Secondary | ICD-10-CM | POA: Insufficient documentation

## 2012-11-29 ENCOUNTER — Encounter (HOSPITAL_COMMUNITY): Payer: Self-pay

## 2012-12-04 ENCOUNTER — Encounter (HOSPITAL_COMMUNITY)
Admission: RE | Admit: 2012-12-04 | Discharge: 2012-12-04 | Disposition: A | Discharge status: Home / Self Care | Payer: Self-pay | Source: Ambulatory Visit | Attending: Interventional Cardiology | Admitting: Interventional Cardiology

## 2012-12-06 ENCOUNTER — Encounter (HOSPITAL_COMMUNITY): Payer: Self-pay

## 2012-12-07 ENCOUNTER — Encounter (HOSPITAL_COMMUNITY)
Admission: RE | Admit: 2012-12-07 | Discharge: 2012-12-07 | Disposition: A | Discharge status: Home / Self Care | Payer: Self-pay | Source: Ambulatory Visit | Attending: Interventional Cardiology | Admitting: Interventional Cardiology

## 2012-12-11 ENCOUNTER — Encounter (HOSPITAL_COMMUNITY)
Admission: RE | Admit: 2012-12-11 | Discharge: 2012-12-11 | Disposition: A | Discharge status: Home / Self Care | Payer: Self-pay | Source: Ambulatory Visit | Attending: Interventional Cardiology | Admitting: Interventional Cardiology

## 2012-12-13 ENCOUNTER — Encounter (HOSPITAL_COMMUNITY)
Admission: RE | Admit: 2012-12-13 | Discharge: 2012-12-13 | Disposition: A | Discharge status: Home / Self Care | Payer: Self-pay | Source: Ambulatory Visit | Attending: Interventional Cardiology | Admitting: Interventional Cardiology

## 2012-12-14 ENCOUNTER — Encounter (HOSPITAL_COMMUNITY): Payer: Self-pay

## 2012-12-18 ENCOUNTER — Encounter (HOSPITAL_COMMUNITY)
Admission: RE | Admit: 2012-12-18 | Discharge: 2012-12-18 | Disposition: A | Discharge status: Home / Self Care | Payer: Self-pay | Source: Ambulatory Visit | Attending: Interventional Cardiology | Admitting: Interventional Cardiology

## 2012-12-20 ENCOUNTER — Encounter (HOSPITAL_COMMUNITY)
Admission: RE | Admit: 2012-12-20 | Discharge: 2012-12-20 | Disposition: A | Discharge status: Home / Self Care | Payer: Self-pay | Source: Ambulatory Visit | Attending: Interventional Cardiology | Admitting: Interventional Cardiology

## 2012-12-21 ENCOUNTER — Encounter (HOSPITAL_COMMUNITY): Payer: Self-pay

## 2012-12-25 ENCOUNTER — Encounter (HOSPITAL_COMMUNITY): Payer: Self-pay

## 2012-12-27 ENCOUNTER — Encounter (HOSPITAL_COMMUNITY): Payer: Self-pay

## 2012-12-28 ENCOUNTER — Encounter (HOSPITAL_COMMUNITY): Payer: Self-pay

## 2012-12-28 DIAGNOSIS — G4733 Obstructive sleep apnea (adult) (pediatric): Secondary | ICD-10-CM | POA: Insufficient documentation

## 2012-12-28 DIAGNOSIS — I359 Nonrheumatic aortic valve disorder, unspecified: Secondary | ICD-10-CM | POA: Insufficient documentation

## 2012-12-28 DIAGNOSIS — E119 Type 2 diabetes mellitus without complications: Secondary | ICD-10-CM | POA: Insufficient documentation

## 2012-12-28 DIAGNOSIS — Z5189 Encounter for other specified aftercare: Secondary | ICD-10-CM | POA: Insufficient documentation

## 2012-12-28 DIAGNOSIS — E785 Hyperlipidemia, unspecified: Secondary | ICD-10-CM | POA: Insufficient documentation

## 2012-12-28 DIAGNOSIS — I10 Essential (primary) hypertension: Secondary | ICD-10-CM | POA: Insufficient documentation

## 2013-01-01 ENCOUNTER — Encounter (HOSPITAL_COMMUNITY): Payer: Self-pay

## 2013-01-03 ENCOUNTER — Encounter (HOSPITAL_COMMUNITY): Payer: Self-pay

## 2013-01-04 ENCOUNTER — Encounter (HOSPITAL_COMMUNITY): Payer: Self-pay

## 2013-01-08 ENCOUNTER — Encounter (HOSPITAL_COMMUNITY): Payer: Self-pay

## 2013-01-09 ENCOUNTER — Encounter (HOSPITAL_COMMUNITY)
Admission: RE | Admit: 2013-01-09 | Discharge: 2013-01-09 | Disposition: A | Discharge status: Home / Self Care | Payer: Self-pay | Source: Ambulatory Visit | Attending: Interventional Cardiology | Admitting: Interventional Cardiology

## 2013-01-10 ENCOUNTER — Encounter (HOSPITAL_COMMUNITY): Payer: Self-pay

## 2013-01-11 ENCOUNTER — Encounter (HOSPITAL_COMMUNITY)
Admission: RE | Admit: 2013-01-11 | Discharge: 2013-01-11 | Disposition: A | Discharge status: Home / Self Care | Payer: Self-pay | Source: Ambulatory Visit | Attending: Interventional Cardiology | Admitting: Interventional Cardiology

## 2013-01-15 ENCOUNTER — Encounter (HOSPITAL_COMMUNITY)
Admission: RE | Admit: 2013-01-15 | Discharge: 2013-01-15 | Disposition: A | Discharge status: Home / Self Care | Payer: Self-pay | Source: Ambulatory Visit | Attending: Interventional Cardiology | Admitting: Interventional Cardiology

## 2013-01-17 ENCOUNTER — Encounter (HOSPITAL_COMMUNITY): Payer: Self-pay

## 2013-01-18 ENCOUNTER — Encounter (HOSPITAL_COMMUNITY): Payer: Self-pay

## 2013-01-22 ENCOUNTER — Encounter (HOSPITAL_COMMUNITY)
Admission: RE | Admit: 2013-01-22 | Discharge: 2013-01-22 | Disposition: A | Discharge status: Home / Self Care | Payer: Self-pay | Source: Ambulatory Visit | Attending: Interventional Cardiology | Admitting: Interventional Cardiology

## 2013-01-24 ENCOUNTER — Encounter (HOSPITAL_COMMUNITY)
Admission: RE | Admit: 2013-01-24 | Discharge: 2013-01-24 | Disposition: A | Discharge status: Home / Self Care | Payer: Self-pay | Source: Ambulatory Visit | Attending: Interventional Cardiology | Admitting: Interventional Cardiology

## 2013-01-25 ENCOUNTER — Encounter (HOSPITAL_COMMUNITY)
Admission: RE | Admit: 2013-01-25 | Discharge: 2013-01-25 | Disposition: A | Discharge status: Home / Self Care | Payer: Self-pay | Source: Ambulatory Visit | Attending: Interventional Cardiology | Admitting: Interventional Cardiology

## 2013-01-29 ENCOUNTER — Encounter (HOSPITAL_COMMUNITY)
Admission: RE | Admit: 2013-01-29 | Discharge: 2013-01-29 | Disposition: A | Discharge status: Home / Self Care | Payer: Medicare Other | Source: Ambulatory Visit | Attending: Interventional Cardiology | Admitting: Interventional Cardiology

## 2013-01-29 DIAGNOSIS — E785 Hyperlipidemia, unspecified: Secondary | ICD-10-CM | POA: Insufficient documentation

## 2013-01-29 DIAGNOSIS — I359 Nonrheumatic aortic valve disorder, unspecified: Secondary | ICD-10-CM | POA: Insufficient documentation

## 2013-01-29 DIAGNOSIS — E119 Type 2 diabetes mellitus without complications: Secondary | ICD-10-CM | POA: Insufficient documentation

## 2013-01-29 DIAGNOSIS — G4733 Obstructive sleep apnea (adult) (pediatric): Secondary | ICD-10-CM | POA: Insufficient documentation

## 2013-01-29 DIAGNOSIS — I10 Essential (primary) hypertension: Secondary | ICD-10-CM | POA: Insufficient documentation

## 2013-01-29 DIAGNOSIS — Z5189 Encounter for other specified aftercare: Secondary | ICD-10-CM | POA: Insufficient documentation

## 2013-01-31 ENCOUNTER — Encounter (HOSPITAL_COMMUNITY)
Admission: RE | Admit: 2013-01-31 | Discharge: 2013-01-31 | Disposition: A | Discharge status: Home / Self Care | Payer: Medicare Other | Source: Ambulatory Visit | Attending: Interventional Cardiology | Admitting: Interventional Cardiology

## 2013-02-01 ENCOUNTER — Encounter (HOSPITAL_COMMUNITY): Payer: Medicare Other

## 2013-02-05 ENCOUNTER — Encounter (HOSPITAL_COMMUNITY): Payer: Medicare Other

## 2013-02-07 ENCOUNTER — Encounter (HOSPITAL_COMMUNITY)
Admission: RE | Admit: 2013-02-07 | Discharge: 2013-02-07 | Disposition: A | Discharge status: Home / Self Care | Payer: Medicare Other | Source: Ambulatory Visit | Attending: Interventional Cardiology | Admitting: Interventional Cardiology

## 2013-02-08 ENCOUNTER — Encounter (HOSPITAL_COMMUNITY)
Admission: RE | Admit: 2013-02-08 | Discharge: 2013-02-08 | Disposition: A | Discharge status: Home / Self Care | Payer: Medicare Other | Source: Ambulatory Visit | Attending: Interventional Cardiology | Admitting: Interventional Cardiology

## 2013-02-12 ENCOUNTER — Encounter (HOSPITAL_COMMUNITY): Payer: Medicare Other

## 2013-02-14 ENCOUNTER — Encounter (HOSPITAL_COMMUNITY)
Admission: RE | Admit: 2013-02-14 | Discharge: 2013-02-14 | Disposition: A | Discharge status: Home / Self Care | Payer: Medicare Other | Source: Ambulatory Visit | Attending: Interventional Cardiology | Admitting: Interventional Cardiology

## 2013-02-15 ENCOUNTER — Encounter (HOSPITAL_COMMUNITY): Payer: Medicare Other

## 2013-02-19 ENCOUNTER — Encounter (HOSPITAL_COMMUNITY): Payer: Medicare Other

## 2013-02-21 ENCOUNTER — Encounter (HOSPITAL_COMMUNITY): Payer: Medicare Other

## 2013-02-22 ENCOUNTER — Encounter (HOSPITAL_COMMUNITY): Payer: Medicare Other

## 2013-02-26 ENCOUNTER — Encounter (HOSPITAL_COMMUNITY): Payer: Medicare Other

## 2013-02-28 ENCOUNTER — Encounter (HOSPITAL_COMMUNITY)
Admission: RE | Admit: 2013-02-28 | Discharge: 2013-02-28 | Disposition: A | Discharge status: Home / Self Care | Payer: Self-pay | Source: Ambulatory Visit | Attending: Interventional Cardiology | Admitting: Interventional Cardiology

## 2013-02-28 DIAGNOSIS — Z5189 Encounter for other specified aftercare: Secondary | ICD-10-CM | POA: Insufficient documentation

## 2013-02-28 DIAGNOSIS — I359 Nonrheumatic aortic valve disorder, unspecified: Secondary | ICD-10-CM | POA: Insufficient documentation

## 2013-02-28 DIAGNOSIS — I10 Essential (primary) hypertension: Secondary | ICD-10-CM | POA: Insufficient documentation

## 2013-03-01 ENCOUNTER — Encounter (HOSPITAL_COMMUNITY): Payer: Medicare Other

## 2013-03-05 ENCOUNTER — Encounter (HOSPITAL_COMMUNITY): Payer: Medicare Other

## 2013-03-05 ENCOUNTER — Encounter (HOSPITAL_BASED_OUTPATIENT_CLINIC_OR_DEPARTMENT_OTHER): Payer: Medicare Other | Attending: General Surgery

## 2013-03-05 DIAGNOSIS — Z954 Presence of other heart-valve replacement: Secondary | ICD-10-CM | POA: Insufficient documentation

## 2013-03-05 DIAGNOSIS — I87309 Chronic venous hypertension (idiopathic) without complications of unspecified lower extremity: Secondary | ICD-10-CM | POA: Insufficient documentation

## 2013-03-05 DIAGNOSIS — I359 Nonrheumatic aortic valve disorder, unspecified: Secondary | ICD-10-CM | POA: Insufficient documentation

## 2013-03-05 DIAGNOSIS — M1990 Unspecified osteoarthritis, unspecified site: Secondary | ICD-10-CM | POA: Insufficient documentation

## 2013-03-05 DIAGNOSIS — I872 Venous insufficiency (chronic) (peripheral): Secondary | ICD-10-CM | POA: Insufficient documentation

## 2013-03-05 DIAGNOSIS — Z96649 Presence of unspecified artificial hip joint: Secondary | ICD-10-CM | POA: Insufficient documentation

## 2013-03-05 DIAGNOSIS — H3320 Serous retinal detachment, unspecified eye: Secondary | ICD-10-CM | POA: Insufficient documentation

## 2013-03-05 DIAGNOSIS — I10 Essential (primary) hypertension: Secondary | ICD-10-CM | POA: Insufficient documentation

## 2013-03-05 DIAGNOSIS — Z96659 Presence of unspecified artificial knee joint: Secondary | ICD-10-CM | POA: Insufficient documentation

## 2013-03-06 NOTE — H&P (Signed)
[place], [place]                   ACCOUNT NO.:  [id number]  MEDICAL RECORD NO.:  [id number]  LOCATION:                                 FACILITY:  PHYSICIAN:  Joanne Gavel, M.D.        DATE OF BIRTH:  [birth year]  DATE OF ADMISSION:  03/05/2013 DATE OF DISCHARGE:  03/05/2013                             HISTORY & PHYSICAL   CHIEF COMPLAINT:  Sore right leg.  HISTORY OF PRESENT ILLNESS:  This patient has been previously treated for chronic venous hypertension with ulcer.  He had drainage from the right leg approximately 3 weeks ago, but this has now been healed and he has no open sores or drainage at the present time.  PAST MEDICAL HISTORY:  Significant for hypertension, diabetes, detached retina, osteoarthritis, and aortic valvular disease.  PAST SURGICAL HISTORY:  Aortic valve replacement in 2013, left knee replacement, right hip replacement, appendectomy, and cataract surgery.  SOCIAL HISTORY:  Cigarettes none.  Alcohol none.  MEDICATIONS:  He did not bring his list.  ALLERGIES:  ADHESIVE TAPE.  REVIEW OF SYSTEMS:  Essentially as above.  PHYSICAL EXAMINATION:  VITAL SIGNS:  Temp 97.6, pulse 56, respiratory rate 18, blood pressure 143/67. GENERAL APPEARANCE:  Well developed, well nourished, no distress. CHEST:  Clear. HEART:  Regular rhythm. EXTREMITIES:  Reveals ABI of 1.1.  There is some venous stasis changes bilaterally, but no swelling and no active ulceration.  IMPRESSION:  Chronic venous hypertension with venous stasis skin changes, but no ulceration.  PLAN:  Elevate and support hose.  See [place] p.r.n.     Joanne Gavel, M.D.   ______________________________ Joanne Gavel, M.D.    RA/MEDQ  D:  03/05/2013  T:  03/06/2013  Job:  [id number]

## 2013-03-07 ENCOUNTER — Encounter (HOSPITAL_COMMUNITY)
Admission: RE | Admit: 2013-03-07 | Discharge: 2013-03-07 | Disposition: A | Discharge status: Home / Self Care | Payer: Self-pay | Source: Ambulatory Visit | Attending: Interventional Cardiology | Admitting: Interventional Cardiology

## 2013-03-08 ENCOUNTER — Encounter (HOSPITAL_COMMUNITY)
Admission: RE | Admit: 2013-03-08 | Discharge: 2013-03-08 | Disposition: A | Discharge status: Home / Self Care | Payer: Self-pay | Source: Ambulatory Visit | Attending: Interventional Cardiology | Admitting: Interventional Cardiology

## 2013-03-12 ENCOUNTER — Encounter (HOSPITAL_COMMUNITY): Payer: Medicare Other

## 2013-03-14 ENCOUNTER — Encounter (HOSPITAL_COMMUNITY)
Admission: RE | Admit: 2013-03-14 | Discharge: 2013-03-14 | Disposition: A | Discharge status: Home / Self Care | Payer: Self-pay | Source: Ambulatory Visit | Attending: Interventional Cardiology | Admitting: Interventional Cardiology

## 2013-03-15 ENCOUNTER — Encounter (HOSPITAL_COMMUNITY)
Admission: RE | Admit: 2013-03-15 | Discharge: 2013-03-15 | Disposition: A | Discharge status: Home / Self Care | Payer: Self-pay | Source: Ambulatory Visit | Attending: Interventional Cardiology | Admitting: Interventional Cardiology

## 2013-03-19 ENCOUNTER — Encounter (HOSPITAL_COMMUNITY): Payer: Medicare Other

## 2013-03-21 ENCOUNTER — Encounter (HOSPITAL_COMMUNITY): Payer: Medicare Other

## 2013-03-22 ENCOUNTER — Encounter (HOSPITAL_COMMUNITY)
Admission: RE | Admit: 2013-03-22 | Discharge: 2013-03-22 | Disposition: A | Discharge status: Home / Self Care | Payer: Self-pay | Source: Ambulatory Visit | Attending: Interventional Cardiology | Admitting: Interventional Cardiology

## 2013-03-26 ENCOUNTER — Encounter (HOSPITAL_COMMUNITY)
Admission: RE | Admit: 2013-03-26 | Discharge: 2013-03-26 | Disposition: A | Discharge status: Home / Self Care | Payer: Self-pay | Source: Ambulatory Visit | Attending: Interventional Cardiology | Admitting: Interventional Cardiology

## 2013-03-28 ENCOUNTER — Encounter (HOSPITAL_COMMUNITY): Payer: Medicare Other

## 2013-03-29 ENCOUNTER — Encounter (HOSPITAL_COMMUNITY): Payer: Medicare Other

## 2013-04-02 ENCOUNTER — Encounter (HOSPITAL_COMMUNITY): Payer: Self-pay

## 2013-04-02 DIAGNOSIS — I359 Nonrheumatic aortic valve disorder, unspecified: Secondary | ICD-10-CM | POA: Insufficient documentation

## 2013-04-02 DIAGNOSIS — I10 Essential (primary) hypertension: Secondary | ICD-10-CM | POA: Insufficient documentation

## 2013-04-02 DIAGNOSIS — Z5189 Encounter for other specified aftercare: Secondary | ICD-10-CM | POA: Insufficient documentation

## 2013-04-03 ENCOUNTER — Encounter (HOSPITAL_COMMUNITY)
Admission: RE | Admit: 2013-04-03 | Discharge: 2013-04-03 | Disposition: A | Discharge status: Home / Self Care | Payer: Self-pay | Source: Ambulatory Visit | Attending: Interventional Cardiology | Admitting: Interventional Cardiology

## 2013-04-04 ENCOUNTER — Encounter (HOSPITAL_COMMUNITY)
Admission: RE | Admit: 2013-04-04 | Discharge: 2013-04-04 | Disposition: A | Discharge status: Home / Self Care | Payer: Self-pay | Source: Ambulatory Visit | Attending: Interventional Cardiology | Admitting: Interventional Cardiology

## 2013-04-05 ENCOUNTER — Encounter (HOSPITAL_COMMUNITY): Payer: Self-pay

## 2013-04-09 ENCOUNTER — Encounter (HOSPITAL_COMMUNITY)
Admission: RE | Admit: 2013-04-09 | Discharge: 2013-04-09 | Disposition: A | Discharge status: Home / Self Care | Payer: Self-pay | Source: Ambulatory Visit | Attending: Interventional Cardiology | Admitting: Interventional Cardiology

## 2013-04-10 ENCOUNTER — Encounter: Payer: Self-pay | Admitting: Cardiology

## 2013-04-11 ENCOUNTER — Ambulatory Visit (INDEPENDENT_AMBULATORY_CARE_PROVIDER_SITE_OTHER): Payer: Medicare Other | Admitting: Cardiology

## 2013-04-11 ENCOUNTER — Encounter: Payer: Self-pay | Admitting: Cardiology

## 2013-04-11 ENCOUNTER — Encounter (HOSPITAL_COMMUNITY): Payer: Self-pay

## 2013-04-11 VITALS — BP 116/62 | HR 63 | Ht 73.0 in | Wt 230.0 lb

## 2013-04-11 DIAGNOSIS — G4733 Obstructive sleep apnea (adult) (pediatric): Secondary | ICD-10-CM

## 2013-04-11 DIAGNOSIS — I10 Essential (primary) hypertension: Secondary | ICD-10-CM

## 2013-04-11 DIAGNOSIS — E669 Obesity, unspecified: Secondary | ICD-10-CM

## 2013-04-11 NOTE — Patient Instructions (Signed)
Your physician recommends that you continue on your current medications as directed. Please refer to the Current Medication list given to you today.  Your physician wants you to follow-up in: 6 Months with Dr Turner You will receive a reminder letter in the mail two months in advance. If you don't receive a letter, please call our office to schedule the follow-up appointment.  

## 2013-04-11 NOTE — Progress Notes (Signed)
[address] [place], [place]  [address] Phone: [phone] Fax:  [phone]  Date:  04/11/2013   ID:  Patrick Henson, DOB 08/20/1940, MRN [id number]  PCP:  Mickie Hillier, MD  Sleep Medicine:  Armanda Magic, MD  History of Present Illness: Patrick Henson is a 72 y.o. male with a history of OSA on CPAP, HTN and obesity who presents today for followup.  He is doing well.  He tolerates his CPAP device well.  He tolerates the mask and feels the pressure is adequate.  He feels rested in the am and has no daytime sleepiness.  His download today showed an AHI of 2/hr and 88% compliance in using more than 4 hours nightly.  His pressure is set on 10cm H2O.  He exercises in cardiac rehab.   Wt Readings from Last 3 Encounters:  10/06/11 241 lb 6.5 oz (109.5 kg)  07/25/11 242 lb (109.77 kg)  07/22/11 244 lb (110.678 kg)     Past Medical History  Diagnosis Date  . Aortic stenosis     AVA .96 cm2.2011  . Diabetes mellitus   . Arthritis   . Obstructive sleep apnea   . Hypertension   . Hyperlipidemia   . Obesity (BMI 30-39.9)     Current Outpatient Prescriptions  Medication Sig Dispense Refill  . amLODipine (NORVASC) 5 MG tablet Take 5 mg by mouth daily.      Marland Kitchen aspirin EC 81 MG tablet Take 81 mg by mouth daily. Takes at bedtime      . atorvastatin (LIPITOR) 20 MG tablet Take 20 mg by mouth daily.      . CHELATED MAGNESIUM PO Take by mouth daily with lunch.      . Cholecalciferol (VITAMIN D3) 1000 UNITS CAPS Take 1,000 Units by mouth daily with lunch.      . metFORMIN (GLUCOPHAGE) 1000 MG tablet Take 1,000 mg by mouth daily with breakfast.       . metoprolol succinate (TOPROL-XL) 25 MG 24 hr tablet Take 25 mg by mouth daily.      . mupirocin ointment (BACTROBAN) 2 % as needed.      . Nutritional Supplements (JUICE PLUS FIBRE PO) Take by mouth daily with lunch. Takes 2 capsules at lunchtime       No current facility-administered medications for this visit.    Allergies:      Allergies  Allergen Reactions  . Actos [Pioglitazone] Swelling    Social History:  The patient  reports that he has never smoked. He has never used smokeless tobacco. He reports that he does not drink alcohol or use illicit drugs.   Family History:  The patient's family history includes Diabetes in his mother; Heart attack in his mother; Heart disease in his mother.   ROS:  Please see the history of present illness.      All other systems reviewed and negative.   PHYSICAL EXAM: VS:  There were no vitals taken for this visit. Well nourished, well developed, in no acute distress HEENT: normal Neck: no JVD Cardiac:  normal S1, S2; RRR; no murmur Lungs:  clear to auscultation bilaterally, no wheezing, rhonchi or rales Abd: soft, nontender, no hepatomegaly Ext: no edema Skin: warm and dry Neuro:  CNs 2-12 intact, no focal abnormalities noted       ASSESSMENT AND PLAN:  1. OSA on CPAP and tolerating well 2. Obesity  - I have encouraged him to increase his frequency of exercise and  follow a low carb diet as well as portion control 3. HTN  - continue amlodipine/lisinopril HCT/metoprolol  Followup with me in 6 months  Signed, Armanda Magic, MD 04/11/2013 10:29 AM

## 2013-04-12 ENCOUNTER — Encounter (HOSPITAL_COMMUNITY)
Admission: RE | Admit: 2013-04-12 | Discharge: 2013-04-12 | Disposition: A | Discharge status: Home / Self Care | Payer: Self-pay | Source: Ambulatory Visit | Attending: Interventional Cardiology | Admitting: Interventional Cardiology

## 2013-04-16 ENCOUNTER — Encounter (HOSPITAL_COMMUNITY): Payer: Self-pay

## 2013-04-18 ENCOUNTER — Encounter (HOSPITAL_COMMUNITY): Payer: Self-pay

## 2013-04-19 ENCOUNTER — Encounter (HOSPITAL_COMMUNITY)
Admission: RE | Admit: 2013-04-19 | Discharge: 2013-04-19 | Disposition: A | Discharge status: Home / Self Care | Payer: Self-pay | Source: Ambulatory Visit | Attending: Interventional Cardiology | Admitting: Interventional Cardiology

## 2013-04-23 ENCOUNTER — Encounter (HOSPITAL_COMMUNITY)
Admission: RE | Admit: 2013-04-23 | Discharge: 2013-04-23 | Disposition: A | Discharge status: Home / Self Care | Payer: Self-pay | Source: Ambulatory Visit | Attending: Interventional Cardiology | Admitting: Interventional Cardiology

## 2013-04-24 ENCOUNTER — Encounter (HOSPITAL_COMMUNITY)
Admission: RE | Admit: 2013-04-24 | Discharge: 2013-04-24 | Disposition: A | Discharge status: Home / Self Care | Payer: Self-pay | Source: Ambulatory Visit | Attending: Interventional Cardiology | Admitting: Interventional Cardiology

## 2013-04-24 NOTE — Progress Notes (Signed)
Patient c/o feeling lightheaded/dizzy after approximately 12 minutes walking the track today. Patient's BP at that time was 108/58, HR 72 bpm. Gatorade given and symptoms resolved with rest. Recheck BP was 116/60 seated and 130/60 standing. Pt asymptomatic at exit.

## 2013-04-30 ENCOUNTER — Encounter (HOSPITAL_COMMUNITY): Payer: Self-pay

## 2013-04-30 DIAGNOSIS — I10 Essential (primary) hypertension: Secondary | ICD-10-CM | POA: Insufficient documentation

## 2013-04-30 DIAGNOSIS — Z5189 Encounter for other specified aftercare: Secondary | ICD-10-CM | POA: Insufficient documentation

## 2013-04-30 DIAGNOSIS — I359 Nonrheumatic aortic valve disorder, unspecified: Secondary | ICD-10-CM | POA: Insufficient documentation

## 2013-05-01 ENCOUNTER — Encounter (HOSPITAL_COMMUNITY)
Admission: RE | Admit: 2013-05-01 | Discharge: 2013-05-01 | Disposition: A | Discharge status: Home / Self Care | Payer: Self-pay | Source: Ambulatory Visit | Attending: Interventional Cardiology | Admitting: Interventional Cardiology

## 2013-05-02 ENCOUNTER — Encounter (HOSPITAL_COMMUNITY)
Admission: RE | Admit: 2013-05-02 | Discharge: 2013-05-02 | Disposition: A | Discharge status: Home / Self Care | Payer: Self-pay | Source: Ambulatory Visit | Attending: Interventional Cardiology | Admitting: Interventional Cardiology

## 2013-05-03 ENCOUNTER — Encounter (HOSPITAL_COMMUNITY): Payer: Self-pay

## 2013-05-07 ENCOUNTER — Encounter (HOSPITAL_COMMUNITY)
Admission: RE | Admit: 2013-05-07 | Discharge: 2013-05-07 | Disposition: A | Discharge status: Home / Self Care | Payer: Self-pay | Source: Ambulatory Visit | Attending: Interventional Cardiology | Admitting: Interventional Cardiology

## 2013-05-09 ENCOUNTER — Encounter (HOSPITAL_COMMUNITY)
Admission: RE | Admit: 2013-05-09 | Discharge: 2013-05-09 | Disposition: A | Discharge status: Home / Self Care | Payer: Self-pay | Source: Ambulatory Visit | Attending: Interventional Cardiology | Admitting: Interventional Cardiology

## 2013-05-10 ENCOUNTER — Encounter (HOSPITAL_COMMUNITY): Payer: Self-pay

## 2013-05-14 ENCOUNTER — Encounter (HOSPITAL_COMMUNITY)
Admission: RE | Admit: 2013-05-14 | Discharge: 2013-05-14 | Disposition: A | Discharge status: Home / Self Care | Payer: Self-pay | Source: Ambulatory Visit | Attending: Interventional Cardiology | Admitting: Interventional Cardiology

## 2013-05-16 ENCOUNTER — Encounter (HOSPITAL_COMMUNITY): Payer: Self-pay

## 2013-05-17 ENCOUNTER — Encounter (HOSPITAL_COMMUNITY): Payer: Self-pay

## 2013-05-21 ENCOUNTER — Encounter (HOSPITAL_COMMUNITY): Payer: Self-pay

## 2013-05-27 ENCOUNTER — Encounter (HOSPITAL_COMMUNITY)
Admission: RE | Admit: 2013-05-27 | Discharge: 2013-05-27 | Disposition: A | Discharge status: Home / Self Care | Payer: Self-pay | Source: Ambulatory Visit | Attending: Interventional Cardiology | Admitting: Interventional Cardiology

## 2013-05-28 ENCOUNTER — Encounter (HOSPITAL_COMMUNITY): Payer: Self-pay

## 2013-05-29 ENCOUNTER — Encounter (HOSPITAL_COMMUNITY)
Admission: RE | Admit: 2013-05-29 | Discharge: 2013-05-29 | Disposition: A | Discharge status: Home / Self Care | Payer: Self-pay | Source: Ambulatory Visit | Attending: Interventional Cardiology | Admitting: Interventional Cardiology

## 2013-05-31 ENCOUNTER — Encounter (HOSPITAL_COMMUNITY): Payer: Self-pay

## 2013-05-31 DIAGNOSIS — Z5189 Encounter for other specified aftercare: Secondary | ICD-10-CM | POA: Insufficient documentation

## 2013-05-31 DIAGNOSIS — I10 Essential (primary) hypertension: Secondary | ICD-10-CM | POA: Insufficient documentation

## 2013-05-31 DIAGNOSIS — I359 Nonrheumatic aortic valve disorder, unspecified: Secondary | ICD-10-CM | POA: Insufficient documentation

## 2013-06-04 ENCOUNTER — Encounter (HOSPITAL_COMMUNITY)
Admission: RE | Admit: 2013-06-04 | Discharge: 2013-06-04 | Disposition: A | Discharge status: Home / Self Care | Payer: Self-pay | Source: Ambulatory Visit | Attending: Interventional Cardiology | Admitting: Interventional Cardiology

## 2013-06-06 ENCOUNTER — Encounter (HOSPITAL_COMMUNITY): Payer: Self-pay

## 2013-06-07 ENCOUNTER — Encounter (HOSPITAL_COMMUNITY): Payer: Self-pay

## 2013-06-11 ENCOUNTER — Encounter (HOSPITAL_COMMUNITY)
Admission: RE | Admit: 2013-06-11 | Discharge: 2013-06-11 | Disposition: A | Discharge status: Home / Self Care | Payer: Self-pay | Source: Ambulatory Visit | Attending: Interventional Cardiology | Admitting: Interventional Cardiology

## 2013-06-13 ENCOUNTER — Encounter (HOSPITAL_COMMUNITY): Payer: Self-pay

## 2013-06-14 ENCOUNTER — Encounter (HOSPITAL_COMMUNITY)
Admission: RE | Admit: 2013-06-14 | Discharge: 2013-06-14 | Disposition: A | Discharge status: Home / Self Care | Payer: Self-pay | Source: Ambulatory Visit | Attending: Interventional Cardiology | Admitting: Interventional Cardiology

## 2013-06-18 ENCOUNTER — Encounter (HOSPITAL_COMMUNITY)
Admission: RE | Admit: 2013-06-18 | Discharge: 2013-06-18 | Disposition: A | Discharge status: Home / Self Care | Payer: Self-pay | Source: Ambulatory Visit | Attending: Interventional Cardiology | Admitting: Interventional Cardiology

## 2013-06-20 ENCOUNTER — Encounter (HOSPITAL_COMMUNITY)
Admission: RE | Admit: 2013-06-20 | Discharge: 2013-06-20 | Disposition: A | Discharge status: Home / Self Care | Payer: Self-pay | Source: Ambulatory Visit | Attending: Interventional Cardiology | Admitting: Interventional Cardiology

## 2013-06-21 ENCOUNTER — Encounter (HOSPITAL_COMMUNITY): Payer: Self-pay

## 2013-06-24 ENCOUNTER — Encounter (HOSPITAL_COMMUNITY)
Admission: RE | Admit: 2013-06-24 | Discharge: 2013-06-24 | Disposition: A | Discharge status: Home / Self Care | Payer: Self-pay | Source: Ambulatory Visit | Attending: Interventional Cardiology | Admitting: Interventional Cardiology

## 2013-06-25 ENCOUNTER — Encounter (HOSPITAL_COMMUNITY): Payer: Self-pay

## 2013-06-27 ENCOUNTER — Encounter (HOSPITAL_COMMUNITY): Payer: Self-pay

## 2013-06-28 ENCOUNTER — Encounter (HOSPITAL_COMMUNITY): Payer: Self-pay

## 2013-07-02 ENCOUNTER — Encounter (HOSPITAL_COMMUNITY): Payer: Medicare Other

## 2013-07-02 DIAGNOSIS — I359 Nonrheumatic aortic valve disorder, unspecified: Secondary | ICD-10-CM | POA: Insufficient documentation

## 2013-07-02 DIAGNOSIS — Z5189 Encounter for other specified aftercare: Secondary | ICD-10-CM | POA: Insufficient documentation

## 2013-07-02 DIAGNOSIS — I10 Essential (primary) hypertension: Secondary | ICD-10-CM | POA: Insufficient documentation

## 2013-07-04 ENCOUNTER — Encounter (HOSPITAL_COMMUNITY): Payer: Medicare Other

## 2013-07-05 ENCOUNTER — Encounter (HOSPITAL_COMMUNITY): Payer: Medicare Other

## 2013-07-09 ENCOUNTER — Encounter (HOSPITAL_COMMUNITY)
Admission: RE | Admit: 2013-07-09 | Discharge: 2013-07-09 | Disposition: A | Discharge status: Home / Self Care | Payer: Self-pay | Source: Ambulatory Visit | Attending: Interventional Cardiology | Admitting: Interventional Cardiology

## 2013-07-11 ENCOUNTER — Encounter (HOSPITAL_COMMUNITY)
Admission: RE | Admit: 2013-07-11 | Discharge: 2013-07-11 | Disposition: A | Discharge status: Home / Self Care | Payer: Self-pay | Source: Ambulatory Visit | Attending: Interventional Cardiology | Admitting: Interventional Cardiology

## 2013-07-12 ENCOUNTER — Encounter (HOSPITAL_COMMUNITY): Payer: Medicare Other

## 2013-07-16 ENCOUNTER — Encounter (HOSPITAL_COMMUNITY): Payer: Medicare Other

## 2013-07-18 ENCOUNTER — Encounter (HOSPITAL_COMMUNITY): Payer: Medicare Other

## 2013-07-19 ENCOUNTER — Encounter (HOSPITAL_COMMUNITY): Payer: Medicare Other

## 2013-07-23 ENCOUNTER — Encounter (HOSPITAL_COMMUNITY): Payer: Medicare Other

## 2013-07-25 ENCOUNTER — Encounter (HOSPITAL_COMMUNITY): Payer: Medicare Other

## 2013-07-26 ENCOUNTER — Encounter (HOSPITAL_COMMUNITY): Payer: Medicare Other

## 2013-07-30 ENCOUNTER — Encounter (HOSPITAL_COMMUNITY)
Admission: RE | Admit: 2013-07-30 | Discharge: 2013-07-30 | Disposition: A | Discharge status: Home / Self Care | Payer: Self-pay | Source: Ambulatory Visit | Attending: Interventional Cardiology | Admitting: Interventional Cardiology

## 2013-07-30 DIAGNOSIS — I10 Essential (primary) hypertension: Secondary | ICD-10-CM | POA: Insufficient documentation

## 2013-07-30 DIAGNOSIS — Z5189 Encounter for other specified aftercare: Secondary | ICD-10-CM | POA: Insufficient documentation

## 2013-07-30 DIAGNOSIS — I359 Nonrheumatic aortic valve disorder, unspecified: Secondary | ICD-10-CM | POA: Insufficient documentation

## 2013-07-31 ENCOUNTER — Other Ambulatory Visit: Payer: Self-pay | Admitting: Interventional Cardiology

## 2013-08-01 ENCOUNTER — Encounter (HOSPITAL_COMMUNITY)
Admission: RE | Admit: 2013-08-01 | Discharge: 2013-08-01 | Disposition: A | Discharge status: Home / Self Care | Payer: Self-pay | Source: Ambulatory Visit | Attending: Interventional Cardiology | Admitting: Interventional Cardiology

## 2013-08-01 NOTE — Telephone Encounter (Signed)
I see that Dr Mayford Knifeurner says to stay on this med, but is this the correct dose as it is not on the med list? Please advise. Thanks, MI

## 2013-08-02 ENCOUNTER — Encounter (HOSPITAL_COMMUNITY): Payer: Medicare Other

## 2013-08-02 NOTE — Telephone Encounter (Signed)
Yes this is correct dosage. Pt sees Dr. Katrinka BlazingSmith for his heart and Dr. Mayford Knifeurner for sleep.

## 2013-08-06 ENCOUNTER — Encounter (HOSPITAL_COMMUNITY): Payer: Medicare Other

## 2013-08-08 ENCOUNTER — Encounter (HOSPITAL_COMMUNITY): Payer: Medicare Other

## 2013-08-09 ENCOUNTER — Encounter (HOSPITAL_COMMUNITY)
Admission: RE | Admit: 2013-08-09 | Discharge: 2013-08-09 | Disposition: A | Discharge status: Home / Self Care | Payer: Self-pay | Source: Ambulatory Visit | Attending: Interventional Cardiology | Admitting: Interventional Cardiology

## 2013-08-13 ENCOUNTER — Encounter (HOSPITAL_COMMUNITY)
Admission: RE | Admit: 2013-08-13 | Discharge: 2013-08-13 | Disposition: A | Discharge status: Home / Self Care | Payer: Self-pay | Source: Ambulatory Visit | Attending: Interventional Cardiology | Admitting: Interventional Cardiology

## 2013-08-13 ENCOUNTER — Telehealth: Payer: Self-pay

## 2013-08-13 DIAGNOSIS — I493 Ventricular premature depolarization: Secondary | ICD-10-CM

## 2013-08-13 LAB — GLUCOSE, CAPILLARY: GLUCOSE-CAPILLARY: 111 mg/dL — AB (ref 70–99)

## 2013-08-13 NOTE — Progress Notes (Addendum)
Patient reported feeling lightheaded while walking the track today at cardiac rehab. Blood pressure 124/62.  Heart rate 60's. Patient placed on the Zoll. Telemetry rhythm sinus with intermittent questionable PVC's.  Exercise stopped. Patient given water symptoms resolved. Patrick Henson says he has having episodes where he feels lightheaded every 2-3 days for the past two weeks. Oxygen saturation 98% on room air. CBG 111. Dr Katrinka BlazingSmith notified of patients symptoms and reviewed today's ECG tracings. Dr Katrinka BlazingSmith said Patrick Henson is okay to go home as long as he feels okay. Dr Michaelle CopasSmith's office will call the patient with further instructions. Patient advised not to return to exercise until we get approval when the patient may return. Will fax exercise flow sheets to Dr. Michaelle CopasSmith's  office for review with today's ECG tracings.

## 2013-08-13 NOTE — Telephone Encounter (Signed)
pt wife aware scheduling will be calling to schedule an appt for  pt lab and holter.pt wife verbalized understanding.

## 2013-08-15 ENCOUNTER — Encounter (HOSPITAL_COMMUNITY): Payer: Medicare Other

## 2013-08-16 ENCOUNTER — Other Ambulatory Visit (INDEPENDENT_AMBULATORY_CARE_PROVIDER_SITE_OTHER): Payer: Medicare Other

## 2013-08-16 ENCOUNTER — Encounter (HOSPITAL_COMMUNITY): Payer: Medicare Other

## 2013-08-16 ENCOUNTER — Encounter (INDEPENDENT_AMBULATORY_CARE_PROVIDER_SITE_OTHER): Payer: Medicare Other

## 2013-08-16 ENCOUNTER — Encounter: Payer: Self-pay | Admitting: Radiology

## 2013-08-16 DIAGNOSIS — I493 Ventricular premature depolarization: Secondary | ICD-10-CM

## 2013-08-16 DIAGNOSIS — I4949 Other premature depolarization: Secondary | ICD-10-CM

## 2013-08-16 LAB — BASIC METABOLIC PANEL
BUN: 15 mg/dL (ref 6–23)
CALCIUM: 9.8 mg/dL (ref 8.4–10.5)
CO2: 31 mEq/L (ref 19–32)
CREATININE: 1 mg/dL (ref 0.4–1.5)
Chloride: 102 mEq/L (ref 96–112)
GFR: 77.41 mL/min (ref >60.00)
Glucose, Bld: 130 mg/dL — ABNORMAL HIGH (ref 70–99)
Potassium: 5 mEq/L (ref 3.5–5.1)
Sodium: 139 mEq/L (ref 135–145)

## 2013-08-16 NOTE — Progress Notes (Signed)
Patient ID: Patrick SiresJack G Alverio, male   DOB: 02/17/1941, 73 y.o.   MRN: [id number] [name] 24hr holter applied

## 2013-08-20 ENCOUNTER — Encounter (HOSPITAL_COMMUNITY)
Admission: RE | Admit: 2013-08-20 | Discharge: 2013-08-20 | Disposition: A | Discharge status: Home / Self Care | Payer: Self-pay | Source: Ambulatory Visit | Attending: Interventional Cardiology | Admitting: Interventional Cardiology

## 2013-08-22 ENCOUNTER — Encounter (HOSPITAL_COMMUNITY): Payer: Medicare Other

## 2013-08-23 ENCOUNTER — Encounter (HOSPITAL_COMMUNITY): Payer: Medicare Other

## 2013-08-23 ENCOUNTER — Ambulatory Visit: Payer: BC Managed Care – PPO | Admitting: Interventional Cardiology

## 2013-08-23 ENCOUNTER — Telehealth: Payer: Self-pay

## 2013-08-23 NOTE — Telephone Encounter (Signed)
pt give results of holter monitor.NSR rare PAC's and PVC's average HR 55bpm max 72 bpm...monitor ok...pt verbalized understanding.

## 2013-08-27 ENCOUNTER — Encounter (HOSPITAL_COMMUNITY)
Admission: RE | Admit: 2013-08-27 | Discharge: 2013-08-27 | Disposition: A | Discharge status: Home / Self Care | Payer: Self-pay | Source: Ambulatory Visit | Attending: Interventional Cardiology | Admitting: Interventional Cardiology

## 2013-08-29 ENCOUNTER — Encounter (HOSPITAL_COMMUNITY): Payer: Self-pay

## 2013-08-29 DIAGNOSIS — I359 Nonrheumatic aortic valve disorder, unspecified: Secondary | ICD-10-CM | POA: Insufficient documentation

## 2013-08-29 DIAGNOSIS — Z5189 Encounter for other specified aftercare: Secondary | ICD-10-CM | POA: Insufficient documentation

## 2013-08-29 DIAGNOSIS — I10 Essential (primary) hypertension: Secondary | ICD-10-CM | POA: Insufficient documentation

## 2013-08-30 ENCOUNTER — Encounter (HOSPITAL_COMMUNITY): Payer: Self-pay

## 2013-09-03 ENCOUNTER — Encounter (HOSPITAL_COMMUNITY)
Admission: RE | Admit: 2013-09-03 | Discharge: 2013-09-03 | Disposition: A | Discharge status: Home / Self Care | Payer: Self-pay | Source: Ambulatory Visit | Attending: Interventional Cardiology | Admitting: Interventional Cardiology

## 2013-09-05 ENCOUNTER — Encounter (HOSPITAL_COMMUNITY): Payer: Self-pay

## 2013-09-06 ENCOUNTER — Encounter (HOSPITAL_COMMUNITY)
Admission: RE | Admit: 2013-09-06 | Discharge: 2013-09-06 | Disposition: A | Discharge status: Home / Self Care | Payer: Self-pay | Source: Ambulatory Visit | Attending: Interventional Cardiology | Admitting: Interventional Cardiology

## 2013-09-10 ENCOUNTER — Encounter (HOSPITAL_COMMUNITY): Payer: Self-pay

## 2013-09-12 ENCOUNTER — Encounter (HOSPITAL_COMMUNITY)
Admission: RE | Admit: 2013-09-12 | Discharge: 2013-09-12 | Disposition: A | Discharge status: Home / Self Care | Payer: Self-pay | Source: Ambulatory Visit | Attending: Interventional Cardiology | Admitting: Interventional Cardiology

## 2013-09-12 ENCOUNTER — Ambulatory Visit: Payer: BC Managed Care – PPO | Admitting: Interventional Cardiology

## 2013-09-12 ENCOUNTER — Encounter (HOSPITAL_COMMUNITY): Payer: Self-pay

## 2013-09-13 ENCOUNTER — Encounter (HOSPITAL_COMMUNITY): Payer: Self-pay

## 2013-09-16 ENCOUNTER — Encounter: Payer: Self-pay | Admitting: Cardiology

## 2013-09-17 ENCOUNTER — Encounter (HOSPITAL_COMMUNITY)
Admission: RE | Admit: 2013-09-17 | Discharge: 2013-09-17 | Disposition: A | Discharge status: Home / Self Care | Payer: Self-pay | Source: Ambulatory Visit | Attending: Interventional Cardiology | Admitting: Interventional Cardiology

## 2013-09-17 ENCOUNTER — Encounter: Payer: Self-pay | Admitting: Interventional Cardiology

## 2013-09-19 ENCOUNTER — Encounter (HOSPITAL_COMMUNITY): Payer: Self-pay

## 2013-09-20 ENCOUNTER — Encounter (HOSPITAL_COMMUNITY)
Admission: RE | Admit: 2013-09-20 | Discharge: 2013-09-20 | Disposition: A | Discharge status: Home / Self Care | Payer: Self-pay | Source: Ambulatory Visit | Attending: Interventional Cardiology | Admitting: Interventional Cardiology

## 2013-09-24 ENCOUNTER — Encounter (HOSPITAL_COMMUNITY): Payer: Self-pay

## 2013-09-26 ENCOUNTER — Encounter (HOSPITAL_COMMUNITY): Payer: Self-pay

## 2013-09-27 ENCOUNTER — Encounter (HOSPITAL_COMMUNITY): Payer: Medicare Other

## 2013-09-27 DIAGNOSIS — Z5189 Encounter for other specified aftercare: Secondary | ICD-10-CM | POA: Insufficient documentation

## 2013-09-27 DIAGNOSIS — I10 Essential (primary) hypertension: Secondary | ICD-10-CM | POA: Insufficient documentation

## 2013-09-27 DIAGNOSIS — I359 Nonrheumatic aortic valve disorder, unspecified: Secondary | ICD-10-CM | POA: Insufficient documentation

## 2013-10-01 ENCOUNTER — Encounter (HOSPITAL_COMMUNITY): Payer: Medicare Other

## 2013-10-02 ENCOUNTER — Other Ambulatory Visit: Payer: Self-pay | Admitting: Interventional Cardiology

## 2013-10-03 ENCOUNTER — Encounter (HOSPITAL_COMMUNITY): Payer: Medicare Other

## 2013-10-04 ENCOUNTER — Encounter (HOSPITAL_COMMUNITY)
Admission: RE | Admit: 2013-10-04 | Discharge: 2013-10-04 | Disposition: A | Discharge status: Home / Self Care | Payer: Self-pay | Source: Ambulatory Visit | Attending: Interventional Cardiology | Admitting: Interventional Cardiology

## 2013-10-08 ENCOUNTER — Encounter (HOSPITAL_COMMUNITY): Payer: Medicare Other

## 2013-10-10 ENCOUNTER — Encounter (HOSPITAL_COMMUNITY): Payer: Medicare Other

## 2013-10-11 ENCOUNTER — Encounter (HOSPITAL_COMMUNITY): Payer: Medicare Other

## 2013-10-12 IMAGING — CT CT HEART MORPH/PULM VEIN W/ CM & W/O CA SCORE
1 of 2 series · 5 of 20 positions shown, 7 images · IV contrast (CONTRAST)
Comparison: None
COMPARISON: None

<!--  IDXRADR:ADDEND:BEGIN -->Addendum Begins
<!--  IDXRADR:ADDEND:INNER_BEGIN -->OVER-READ INTERPRETATION - CT CHEST

The following report is an over-read performed by radiologist Dr.
[DATE].  This over-read does not include interpretation of
cardiac or coronary anatomy or pathology.  The CTA interpretation
by the cardiologist is attached.
INDICATION: Planning of Aortic Valve Replacement
PROTOCOL: The patient was scanned on a [employer] [REDACTED]ice scanner.
Gantry rotation speed was 300 msec.  Collimation was .9mm.  The
patient received 5 mg of i.v. lopresser and sublingual nitro.
Average HR during the scan was 19bpm.  A 120 kV gated retrospective
scan was prescribed from the clavicles to the diaphragm and
triggered in the descending thoracic aorta at 111 HU's.  mA was
reduce to 300 and idose set to level 3 to save dose.  Full mA was
only used during phases 0%,40% and 75% of the R-R cycle.  The
patient received 80cc of contrast delivered at 5cc/sec.  The 3D
data set was analyzed on a [employer] work station using MIP, CMPR and
VRT modes.

[Series 903: batch 3 · coronal · 0.45mm/px · 5 of 10 slices shown, 7 images]
[im 2/10  vessel]
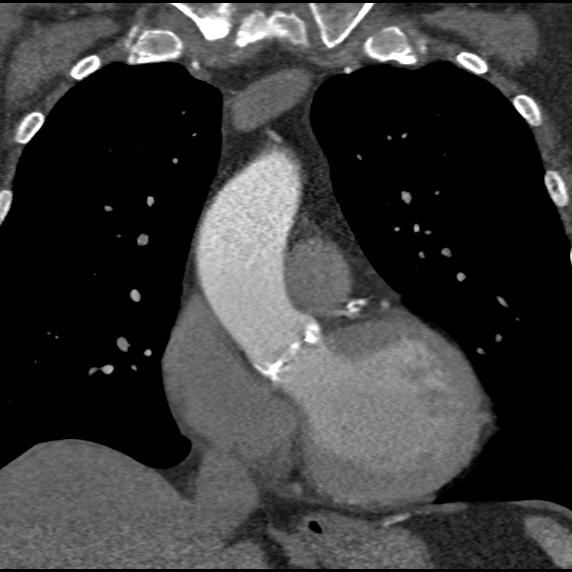
[im 2/10  lung]
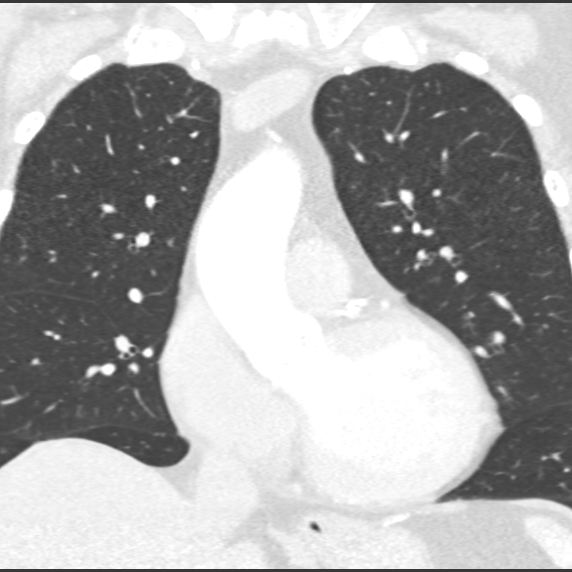
[im 4/10  vessel]
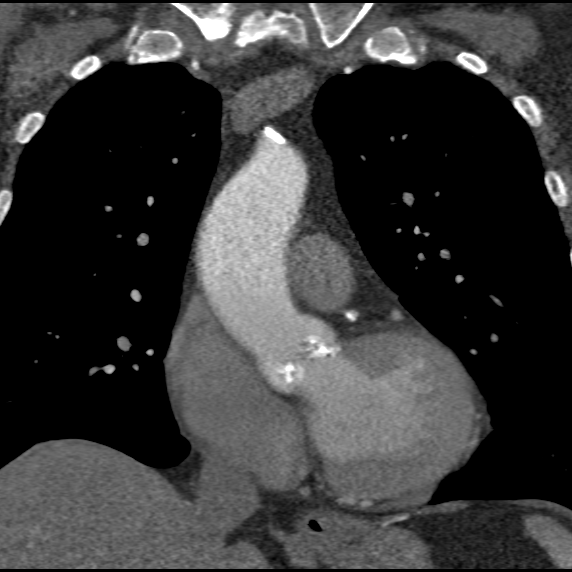
[im 5/10  vessel]
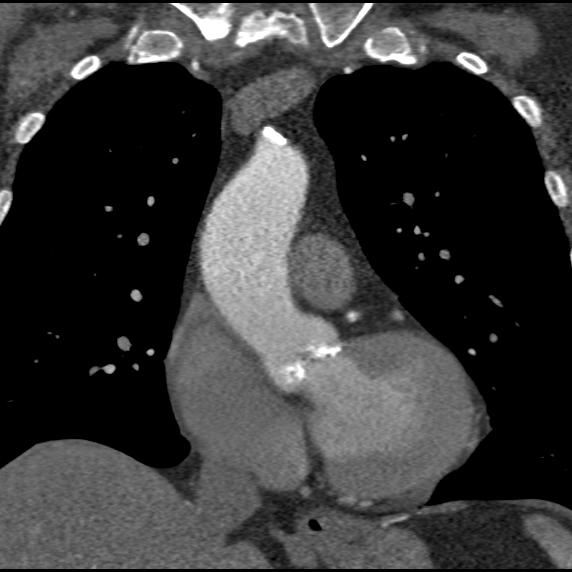
[im 7/10  vessel]
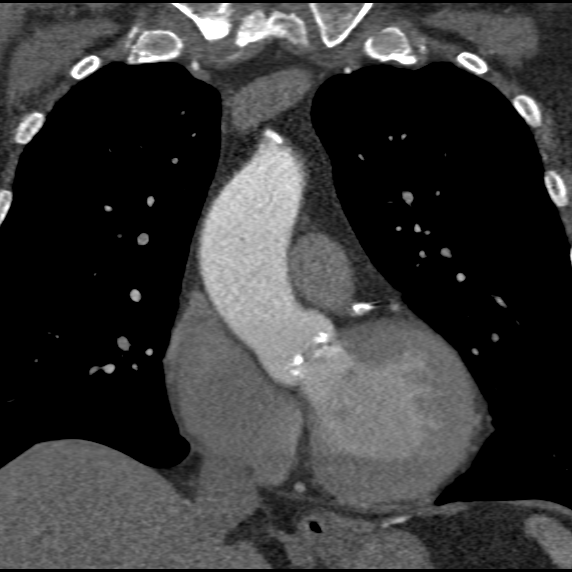
[im 8/10  vessel]
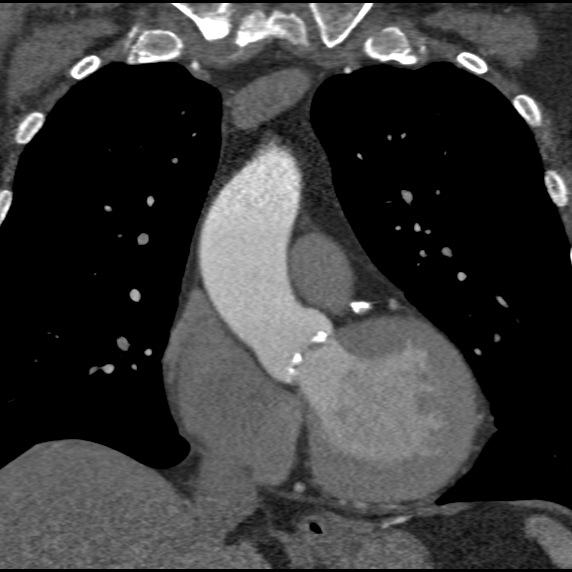
[im 8/10  lung]
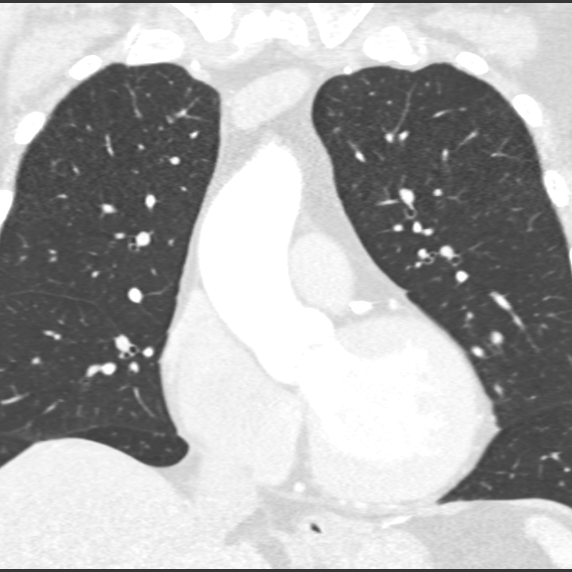

[5 of 20 positions shown; findings below may reference images not displayed]

FINDINGS: Review of the lung parenchyma demonstrates no suspicious
pulmonary nodules.  Mild pleural thickening in the inferior
lingula.  Airways are normal.

No axillary or supraclavicular lymphadenopathy.  No mediastinal
lymphadenopathy.  No pericardial fluid.  Esophagus is normal.

Limited view of the upper abdomen is unremarkable.  Limited view of
the skeleton demonstrates degenerative spurring of the thoracic
spine. Small benign-appearing sclerotic lesion within the upper
thoracic spine.
IMPRESSION: No significant extracardiac findings.
Cardiac CT Morphology:
FINDINGS: Coronary Arteries:

Right dominant with no anomalies

LM-normal

LAD: Heavy calcification of the proximal and mid vessel with no
flow limiting lesions
      D1: calcified ostium with <50% proximal stenosis

Circumflex: Scattered calcification in the proximal and mid vessel
with no flow limiting lesions
      OM1: Calcified proximally with < 50% proximal stenosis

      AV Groove: normal and large distal vessel

RCA: Calcified proximally with <50% tubular disease proximally
      RV Branches: two with proximal RV branch >50% ostial stenosis
      PDA/PLA: normal

Left Ventricle:  Moderate LVH with septal thickness 15mm Normal
size and function

EDV: 424cc
ESV: 79 cc
SV: 139 cc

Ejection Fraction: Biplane 64%

Aortic Valve:  Trileaflet with heavy grade 4 calcification of all 3
leaflets especially at the leaflet edges.  Valve plane is
approximately 11 cm inferior to the sternomanubrial junction and
just to the right of the sterno-xyphoid junction. The valve should
be accessible from the 4th intercostal space.

Sinus of Valsalva: Commissural diameters

      Noncoronary Cusp- 34.3 mm
      Right coronary Cusp- 34.4 mm
      Left coronary Cusp- 34.1 mm

Normal depth with LM ostium 14.7 mm above valve plane and RCA
ostium 15.1 mm above valve plane

Virtual Basal Ring:

Area: 580 sqcm- derived diameter = 27.2 mm
Perimeter 97.2 mm- derived diameter = 31 mm
Min/Max 24.8 mm and 30.6mm Average =  27.7 mm

Aorta: Mild ascending aortic root dilatation.  Normal origin of the
arch vessels.  Dense calcification of the origin of the innominate
artery

Sinus of Valsalva: 34.2 mm
ST junction: 28.8 mm
Ascending Aorta: max at RPA 39 mm ( 35 mm 4cm distal to valve)
IMPRESSION: 1)    Right dominant coronary arteries with 50% or less stenosis in
      the ostium of D1, promimal OM1 and proximal RCA.  See body of
      report for details

2)    Heavily calcified trileaflet valve accessible from 4th
intercostal space
3)    Mild aortic root dilatation 3.9 cm Normal origin of arch
vessels with heavy calcification of innominate ostium
4)    Normal EF 64%
5)    Average virtual basal ring diameter 28.6 mm ( based on
Min/Max, Area, and perimeter)
6)    See separate report from [REDACTED] regarding
distal aorta and iliac peripheral vascular exam.
Cc:  Dr Relindas Auad

<!--  IDXRADR:ADDEND:INNER_END -->Addendum Ends
<!--  IDXRADR:ADDEND:END -->OVER-READ INTERPRETATION - CT CHEST

The following report is an over-read performed by radiologist Dr.
[DATE].  This over-read does not include interpretation of
cardiac or coronary anatomy or pathology.  The CTA interpretation
by the cardiologist is attached.
FINDINGS: Review of the lung parenchyma demonstrates no suspicious
pulmonary nodules.  Mild pleural thickening in the inferior
lingula.  Airways are normal.

No axillary or supraclavicular lymphadenopathy.  No mediastinal
lymphadenopathy.  No pericardial fluid.  Esophagus is normal.

Limited view of the upper abdomen is unremarkable.  Limited view of
the skeleton demonstrates degenerative spurring of the thoracic
spine. Small benign-appearing sclerotic lesion within the upper
thoracic spine.
IMPRESSION: No significant extracardiac findings.

## 2013-10-15 ENCOUNTER — Encounter (HOSPITAL_COMMUNITY): Payer: Medicare Other

## 2013-10-17 ENCOUNTER — Encounter (HOSPITAL_COMMUNITY): Payer: Medicare Other

## 2013-10-18 ENCOUNTER — Encounter (HOSPITAL_COMMUNITY)
Admission: RE | Admit: 2013-10-18 | Discharge: 2013-10-18 | Disposition: A | Discharge status: Home / Self Care | Payer: Self-pay | Source: Ambulatory Visit | Attending: Interventional Cardiology | Admitting: Interventional Cardiology

## 2013-10-22 ENCOUNTER — Encounter (HOSPITAL_COMMUNITY)
Admission: RE | Admit: 2013-10-22 | Discharge: 2013-10-22 | Disposition: A | Discharge status: Home / Self Care | Payer: Self-pay | Source: Ambulatory Visit | Attending: Interventional Cardiology | Admitting: Interventional Cardiology

## 2013-10-24 ENCOUNTER — Encounter (HOSPITAL_COMMUNITY): Payer: Medicare Other

## 2013-10-25 ENCOUNTER — Encounter (HOSPITAL_COMMUNITY)
Admission: RE | Admit: 2013-10-25 | Discharge: 2013-10-25 | Disposition: A | Discharge status: Home / Self Care | Payer: Self-pay | Source: Ambulatory Visit | Attending: Interventional Cardiology | Admitting: Interventional Cardiology

## 2013-10-28 ENCOUNTER — Ambulatory Visit: Payer: Medicare Other | Admitting: Interventional Cardiology

## 2013-10-28 ENCOUNTER — Encounter: Payer: Self-pay | Admitting: Interventional Cardiology

## 2013-10-29 ENCOUNTER — Encounter (HOSPITAL_COMMUNITY): Payer: Self-pay

## 2013-10-29 DIAGNOSIS — I359 Nonrheumatic aortic valve disorder, unspecified: Secondary | ICD-10-CM | POA: Insufficient documentation

## 2013-10-29 DIAGNOSIS — Z5189 Encounter for other specified aftercare: Secondary | ICD-10-CM | POA: Insufficient documentation

## 2013-10-29 DIAGNOSIS — I10 Essential (primary) hypertension: Secondary | ICD-10-CM | POA: Insufficient documentation

## 2013-10-31 ENCOUNTER — Encounter (HOSPITAL_COMMUNITY): Payer: Self-pay

## 2013-10-31 ENCOUNTER — Telehealth: Payer: Self-pay | Admitting: Interventional Cardiology

## 2013-10-31 NOTE — Telephone Encounter (Signed)
Agree and continue to monitor.

## 2013-10-31 NOTE — Telephone Encounter (Signed)
returned pt call this morning pt sts that he had one epiosode of tightening in his left arm.pt sts that it lasted 2-3 min.pt sts that it did not radiate .pt denied chest pain,sob,palpitations.pt sts that it was his first time experiencing what he described as a tightening.pt sts that this happened yesterday and has not reoccurred.adv pt that I dont think he needs to be seen today.adv pt that since he has no active symptoms, I will fwd Dr.Smith an FYI and call back if there are any recommendations.pt thanked me for the call back.pt agreeable with plan and verbalized understanding.

## 2013-10-31 NOTE — Telephone Encounter (Signed)
New message          Pt felt a tight squeeze in left bicep / bp reading was normal / sugar was normal. Pt is requesting to be seen today.

## 2013-11-01 ENCOUNTER — Encounter (HOSPITAL_COMMUNITY): Payer: Self-pay

## 2013-11-04 ENCOUNTER — Encounter (HOSPITAL_COMMUNITY)
Admission: RE | Admit: 2013-11-04 | Discharge: 2013-11-04 | Disposition: A | Discharge status: Home / Self Care | Payer: Self-pay | Source: Ambulatory Visit | Attending: Interventional Cardiology | Admitting: Interventional Cardiology

## 2013-11-05 ENCOUNTER — Encounter (HOSPITAL_COMMUNITY): Payer: Self-pay

## 2013-11-07 ENCOUNTER — Encounter (HOSPITAL_COMMUNITY)
Admission: RE | Admit: 2013-11-07 | Discharge: 2013-11-07 | Disposition: A | Discharge status: Home / Self Care | Payer: Self-pay | Source: Ambulatory Visit | Attending: Interventional Cardiology | Admitting: Interventional Cardiology

## 2013-11-08 ENCOUNTER — Encounter (HOSPITAL_COMMUNITY): Payer: Self-pay

## 2013-11-12 ENCOUNTER — Encounter (HOSPITAL_COMMUNITY)
Admission: RE | Admit: 2013-11-12 | Discharge: 2013-11-12 | Disposition: A | Discharge status: Home / Self Care | Payer: Self-pay | Source: Ambulatory Visit | Attending: Interventional Cardiology | Admitting: Interventional Cardiology

## 2013-11-13 ENCOUNTER — Ambulatory Visit: Payer: BC Managed Care – PPO | Admitting: Interventional Cardiology

## 2013-11-14 ENCOUNTER — Encounter (HOSPITAL_COMMUNITY)
Admission: RE | Admit: 2013-11-14 | Discharge: 2013-11-14 | Disposition: A | Discharge status: Home / Self Care | Payer: Self-pay | Source: Ambulatory Visit | Attending: Interventional Cardiology | Admitting: Interventional Cardiology

## 2013-11-15 ENCOUNTER — Encounter (HOSPITAL_COMMUNITY): Payer: Self-pay

## 2013-11-19 ENCOUNTER — Encounter (HOSPITAL_COMMUNITY)
Admission: RE | Admit: 2013-11-19 | Discharge: 2013-11-19 | Disposition: A | Discharge status: Home / Self Care | Payer: Self-pay | Source: Ambulatory Visit | Attending: Interventional Cardiology | Admitting: Interventional Cardiology

## 2013-11-21 ENCOUNTER — Encounter (HOSPITAL_COMMUNITY)
Admission: RE | Admit: 2013-11-21 | Discharge: 2013-11-21 | Disposition: A | Discharge status: Home / Self Care | Payer: Self-pay | Source: Ambulatory Visit | Attending: Interventional Cardiology | Admitting: Interventional Cardiology

## 2013-11-22 ENCOUNTER — Encounter (HOSPITAL_COMMUNITY)
Admission: RE | Admit: 2013-11-22 | Discharge: 2013-11-22 | Disposition: A | Discharge status: Home / Self Care | Payer: Self-pay | Source: Ambulatory Visit | Attending: Interventional Cardiology | Admitting: Interventional Cardiology

## 2013-11-26 ENCOUNTER — Encounter (HOSPITAL_COMMUNITY)
Admission: RE | Admit: 2013-11-26 | Discharge: 2013-11-26 | Disposition: A | Discharge status: Home / Self Care | Payer: Self-pay | Source: Ambulatory Visit | Attending: Interventional Cardiology | Admitting: Interventional Cardiology

## 2013-11-28 ENCOUNTER — Encounter (HOSPITAL_COMMUNITY): Payer: Medicare Other

## 2013-11-28 DIAGNOSIS — Z5189 Encounter for other specified aftercare: Secondary | ICD-10-CM | POA: Insufficient documentation

## 2013-11-28 DIAGNOSIS — I10 Essential (primary) hypertension: Secondary | ICD-10-CM | POA: Insufficient documentation

## 2013-11-28 DIAGNOSIS — I359 Nonrheumatic aortic valve disorder, unspecified: Secondary | ICD-10-CM | POA: Insufficient documentation

## 2013-11-29 ENCOUNTER — Encounter (HOSPITAL_COMMUNITY): Payer: Medicare Other

## 2013-12-03 ENCOUNTER — Encounter (HOSPITAL_COMMUNITY)
Admission: RE | Admit: 2013-12-03 | Discharge: 2013-12-03 | Disposition: A | Discharge status: Home / Self Care | Payer: Self-pay | Source: Ambulatory Visit | Attending: Interventional Cardiology | Admitting: Interventional Cardiology

## 2013-12-05 ENCOUNTER — Encounter (HOSPITAL_COMMUNITY)
Admission: RE | Admit: 2013-12-05 | Discharge: 2013-12-05 | Disposition: A | Discharge status: Home / Self Care | Payer: Self-pay | Source: Ambulatory Visit | Attending: Interventional Cardiology | Admitting: Interventional Cardiology

## 2013-12-06 ENCOUNTER — Encounter (HOSPITAL_COMMUNITY): Payer: Medicare Other

## 2013-12-10 ENCOUNTER — Encounter (HOSPITAL_COMMUNITY)
Admission: RE | Admit: 2013-12-10 | Discharge: 2013-12-10 | Disposition: A | Discharge status: Home / Self Care | Payer: Self-pay | Source: Ambulatory Visit | Attending: Interventional Cardiology | Admitting: Interventional Cardiology

## 2013-12-12 ENCOUNTER — Encounter (HOSPITAL_COMMUNITY)
Admission: RE | Admit: 2013-12-12 | Discharge: 2013-12-12 | Disposition: A | Discharge status: Home / Self Care | Payer: Self-pay | Source: Ambulatory Visit | Attending: Interventional Cardiology | Admitting: Interventional Cardiology

## 2013-12-13 ENCOUNTER — Encounter (HOSPITAL_COMMUNITY): Payer: Medicare Other

## 2013-12-16 ENCOUNTER — Ambulatory Visit: Payer: BC Managed Care – PPO | Admitting: Interventional Cardiology

## 2013-12-17 ENCOUNTER — Encounter (HOSPITAL_COMMUNITY): Payer: Medicare Other

## 2013-12-19 ENCOUNTER — Encounter (HOSPITAL_COMMUNITY)
Admission: RE | Admit: 2013-12-19 | Discharge: 2013-12-19 | Disposition: A | Discharge status: Home / Self Care | Payer: Self-pay | Source: Ambulatory Visit | Attending: Interventional Cardiology | Admitting: Interventional Cardiology

## 2013-12-20 ENCOUNTER — Encounter (HOSPITAL_COMMUNITY): Payer: Medicare Other

## 2013-12-24 ENCOUNTER — Encounter (HOSPITAL_COMMUNITY): Payer: Medicare Other

## 2013-12-26 ENCOUNTER — Encounter (HOSPITAL_COMMUNITY): Payer: Medicare Other

## 2013-12-27 ENCOUNTER — Encounter (HOSPITAL_COMMUNITY): Payer: Medicare Other

## 2013-12-29 ENCOUNTER — Other Ambulatory Visit: Payer: Self-pay | Admitting: Interventional Cardiology

## 2013-12-31 ENCOUNTER — Ambulatory Visit (INDEPENDENT_AMBULATORY_CARE_PROVIDER_SITE_OTHER): Payer: Medicare Other | Admitting: Interventional Cardiology

## 2013-12-31 ENCOUNTER — Encounter (HOSPITAL_COMMUNITY): Payer: Self-pay

## 2013-12-31 ENCOUNTER — Encounter: Payer: Self-pay | Admitting: Interventional Cardiology

## 2013-12-31 VITALS — BP 117/52 | HR 56 | Ht 72.0 in | Wt 228.0 lb

## 2013-12-31 DIAGNOSIS — I453 Trifascicular block: Secondary | ICD-10-CM

## 2013-12-31 DIAGNOSIS — Z952 Presence of prosthetic heart valve: Secondary | ICD-10-CM

## 2013-12-31 DIAGNOSIS — I480 Paroxysmal atrial fibrillation: Secondary | ICD-10-CM | POA: Insufficient documentation

## 2013-12-31 DIAGNOSIS — I10 Essential (primary) hypertension: Secondary | ICD-10-CM | POA: Insufficient documentation

## 2013-12-31 DIAGNOSIS — Z953 Presence of xenogenic heart valve: Secondary | ICD-10-CM | POA: Insufficient documentation

## 2013-12-31 DIAGNOSIS — I4510 Unspecified right bundle-branch block: Secondary | ICD-10-CM

## 2013-12-31 DIAGNOSIS — I4891 Unspecified atrial fibrillation: Secondary | ICD-10-CM

## 2013-12-31 DIAGNOSIS — Z5189 Encounter for other specified aftercare: Secondary | ICD-10-CM | POA: Insufficient documentation

## 2013-12-31 DIAGNOSIS — I359 Nonrheumatic aortic valve disorder, unspecified: Secondary | ICD-10-CM | POA: Insufficient documentation

## 2013-12-31 NOTE — Patient Instructions (Signed)
Your physician recommends that you continue on your current medications as directed. Please refer to the Current Medication list given to you today.  Your physician has requested that you have an echocardiogram. Echocardiography is a painless test that uses sound waves to create images of your heart. It provides your doctor with information about the size and shape of your heart and how well your heart's chambers and valves are working. This procedure takes approximately one hour. There are no restrictions for this procedure.   Your physician has recommended that you wear an event monitor. Event monitors are medical devices that record the heart's electrical activity. Doctors most often us these monitors to diagnose arrhythmias. Arrhythmias are problems with the speed or rhythm of the heartbeat. The monitor is a small, portable device. You can wear one while you do your normal daily activities. This is usually used to diagnose what is causing palpitations/syncope (passing out).  Your physician wants you to follow-up in: 1 year You will receive a reminder letter in the mail two months in advance. If you don't receive a letter, please call our office to schedule the follow-up appointment.

## 2013-12-31 NOTE — Progress Notes (Signed)
[address] [place], [place]  [address] Phone: [phone] Fax:  [phone]  Date:  12/31/2013   ID:  Patrick Henson, DOB 01/20/1941, MRN [id number]  PCP:  Mickie Hillier, MD   ASSESSMENT:  1. Aortic valve prosthesis, bioprosthetic normal function 2. Hypertension 3. Periodic fatigue, raising the question of paroxysmal atrial fibrillation. A prior 24-hour Holter was performed in March with a question of atrial fibrillation but was not definite. With continued symptoms atrial fibrillation needs to be excluded by a longer period of monitoring 4. Trifascicular block, right bundle, left anterior hemiblock, and first-degree AV block 5. Shoulder and chest pain  PLAN:  1. Clinical followup in one year or earlier if below workup identified significant problems. I am concerned that the patient may be having transient arrhythmias account for the periods of fatigue 2. 30 day continuous ambulatory mild to rule out atrial fibrillation or excessive bradycardia given the patient's trifascicular block 3. 2-D Doppler echocardiogram    SUBJECTIVE: Patrick Henson is a 73 y.o. male was done relatively well. He is 2 years post aortic valve replacement at [facility] with minithoracotomy approach. Since I last saw him he complains that most days he has a 10-30 minute episode of extreme fatigue and weakness. If he stops and rests for 20-30 minutes it usually goes away. He does not feel palpitations or dizziness. He denies chest discomfort. He has had 2 episodes of left shoulder tightness. A stress test done approximately a year ago was unremarkable for evidence of ischemia.   Wt Readings from Last 3 Encounters:  12/31/13 228 lb (103.42 kg)  04/11/13 230 lb (104.327 kg)  10/06/11 241 lb 6.5 oz (109.5 kg)     Past Medical History  Diagnosis Date  . Aortic stenosis     AVA .96 cm2.2011  . Diabetes mellitus   . Arthritis   . Obstructive sleep apnea   . Hypertension    . Hyperlipidemia   . Obesity (BMI 30-39.9)     Current Outpatient Prescriptions  Medication Sig Dispense Refill  . amLODipine (NORVASC) 5 MG tablet TAKE ONE TABLET BY MOUTH DAILY  90 tablet  0  . aspirin EC 81 MG tablet Take 81 mg by mouth daily. Takes at bedtime      . atorvastatin (LIPITOR) 20 MG tablet Take 20 mg by mouth daily.      . finasteride (PROSCAR) 5 MG tablet Take 5 mg by mouth daily.      Marland Kitchen lisinopril-hydrochlorothiazide (PRINZIDE,ZESTORETIC) 10-12.5 MG per tablet TAKE 1 TABLET BY MOUTH EVERY DAY  30 tablet  9  . metFORMIN (GLUCOPHAGE) 1000 MG tablet Take 1,000 mg by mouth daily with breakfast.       . metoprolol succinate (TOPROL-XL) 25 MG 24 hr tablet Take 25 mg by mouth daily.       No current facility-administered medications for this visit.    Allergies:    Allergies  Allergen Reactions  . Actos [Pioglitazone] Swelling    Social History:  The patient  reports that he has never smoked. He has never used smokeless tobacco. He reports that he does not drink alcohol or use illicit drugs.   ROS:  Please see the history of present illness.   Decreased memory. Periodic unexplained weakness. No syncope or neurological complaints. No orthopnea, PND, or edema.   All other systems reviewed and negative.   OBJECTIVE: VS:  BP 117/52  Pulse 56  Ht 6' (1.829  m)  Wt 228 lb (103.42 kg)  BMI 30.92 kg/m2 Well nourished, well developed, in no acute distress, elderly  HEENT: normal Neck: JVD flat. Carotid bruit absent  Cardiac:  normal S1, S2; RRR; no murmur Lungs:  clear to auscultation bilaterally, no wheezing, rhonchi or rales Abd: soft, nontender, no hepatomegaly Ext: Edema absent. Pulses 2+ and symmetric  Skin: warm and dry Neuro:  CNs 2-12 intact, no focal abnormalities noted  EKG:  Right bundle with left anterior hemiblock and normal sinus rhythm with first-degree AV block       Signed, Darci NeedleHenry W. B. Tylie Golonka III, MD 12/31/2013 10:10 AM

## 2014-01-01 ENCOUNTER — Encounter (HOSPITAL_COMMUNITY)
Admission: RE | Admit: 2014-01-01 | Discharge: 2014-01-01 | Disposition: A | Discharge status: Home / Self Care | Payer: Self-pay | Source: Ambulatory Visit | Attending: Interventional Cardiology | Admitting: Interventional Cardiology

## 2014-01-02 ENCOUNTER — Encounter (HOSPITAL_COMMUNITY): Payer: Self-pay

## 2014-01-03 ENCOUNTER — Encounter (HOSPITAL_COMMUNITY)
Admission: RE | Admit: 2014-01-03 | Discharge: 2014-01-03 | Disposition: A | Discharge status: Home / Self Care | Payer: Self-pay | Source: Ambulatory Visit | Attending: Interventional Cardiology | Admitting: Interventional Cardiology

## 2014-01-06 ENCOUNTER — Encounter (HOSPITAL_COMMUNITY)
Admission: RE | Admit: 2014-01-06 | Discharge: 2014-01-06 | Disposition: A | Discharge status: Home / Self Care | Payer: Self-pay | Source: Ambulatory Visit | Attending: Interventional Cardiology | Admitting: Interventional Cardiology

## 2014-01-07 ENCOUNTER — Encounter (HOSPITAL_COMMUNITY): Payer: Self-pay

## 2014-01-08 ENCOUNTER — Encounter (INDEPENDENT_AMBULATORY_CARE_PROVIDER_SITE_OTHER): Payer: Medicare Other

## 2014-01-08 ENCOUNTER — Encounter (HOSPITAL_COMMUNITY): Payer: Self-pay

## 2014-01-08 ENCOUNTER — Ambulatory Visit (HOSPITAL_COMMUNITY): Payer: Medicare Other | Attending: Internal Medicine | Admitting: Cardiology

## 2014-01-08 ENCOUNTER — Encounter: Payer: Self-pay | Admitting: *Deleted

## 2014-01-08 DIAGNOSIS — E785 Hyperlipidemia, unspecified: Secondary | ICD-10-CM | POA: Diagnosis not present

## 2014-01-08 DIAGNOSIS — I480 Paroxysmal atrial fibrillation: Secondary | ICD-10-CM

## 2014-01-08 DIAGNOSIS — E669 Obesity, unspecified: Secondary | ICD-10-CM | POA: Insufficient documentation

## 2014-01-08 DIAGNOSIS — I10 Essential (primary) hypertension: Secondary | ICD-10-CM | POA: Diagnosis not present

## 2014-01-08 DIAGNOSIS — I517 Cardiomegaly: Secondary | ICD-10-CM | POA: Diagnosis not present

## 2014-01-08 DIAGNOSIS — I379 Nonrheumatic pulmonary valve disorder, unspecified: Secondary | ICD-10-CM | POA: Insufficient documentation

## 2014-01-08 DIAGNOSIS — I059 Rheumatic mitral valve disease, unspecified: Secondary | ICD-10-CM | POA: Diagnosis not present

## 2014-01-08 DIAGNOSIS — Z953 Presence of xenogenic heart valve: Secondary | ICD-10-CM

## 2014-01-08 DIAGNOSIS — E119 Type 2 diabetes mellitus without complications: Secondary | ICD-10-CM | POA: Diagnosis not present

## 2014-01-08 DIAGNOSIS — I079 Rheumatic tricuspid valve disease, unspecified: Secondary | ICD-10-CM | POA: Insufficient documentation

## 2014-01-08 DIAGNOSIS — I359 Nonrheumatic aortic valve disorder, unspecified: Secondary | ICD-10-CM | POA: Diagnosis not present

## 2014-01-08 DIAGNOSIS — I4891 Unspecified atrial fibrillation: Secondary | ICD-10-CM

## 2014-01-08 NOTE — Progress Notes (Signed)
Patient ID: Patrick SiresJack G Henson, male   DOB: 09/10/1940, 73 y.o.   MRN: [id number] [employer] 30 day cardiac event monitor applied to patient.

## 2014-01-08 NOTE — Progress Notes (Signed)
Echo performed. 

## 2014-01-09 ENCOUNTER — Encounter (HOSPITAL_COMMUNITY): Payer: Self-pay

## 2014-01-10 ENCOUNTER — Encounter (HOSPITAL_COMMUNITY): Payer: Self-pay

## 2014-01-10 ENCOUNTER — Encounter (HOSPITAL_COMMUNITY)
Admission: RE | Admit: 2014-01-10 | Discharge: 2014-01-10 | Disposition: A | Discharge status: Home / Self Care | Payer: Self-pay | Source: Ambulatory Visit | Attending: Interventional Cardiology | Admitting: Interventional Cardiology

## 2014-01-13 ENCOUNTER — Encounter (HOSPITAL_COMMUNITY)
Admission: RE | Admit: 2014-01-13 | Discharge: 2014-01-13 | Disposition: A | Discharge status: Home / Self Care | Payer: Self-pay | Source: Ambulatory Visit | Attending: Interventional Cardiology | Admitting: Interventional Cardiology

## 2014-01-14 ENCOUNTER — Encounter (HOSPITAL_COMMUNITY): Payer: Self-pay

## 2014-01-15 ENCOUNTER — Encounter (HOSPITAL_COMMUNITY)
Admission: RE | Admit: 2014-01-15 | Discharge: 2014-01-15 | Disposition: A | Discharge status: Home / Self Care | Payer: Self-pay | Source: Ambulatory Visit | Attending: Interventional Cardiology | Admitting: Interventional Cardiology

## 2014-01-16 ENCOUNTER — Encounter (HOSPITAL_COMMUNITY): Payer: Self-pay

## 2014-01-16 ENCOUNTER — Telehealth: Payer: Self-pay | Admitting: Interventional Cardiology

## 2014-01-16 NOTE — Telephone Encounter (Signed)
New message ° ° ° ° ° ° °Want echo and monitor results °

## 2014-01-17 ENCOUNTER — Encounter (HOSPITAL_COMMUNITY)
Admission: RE | Admit: 2014-01-17 | Discharge: 2014-01-17 | Disposition: A | Discharge status: Home / Self Care | Payer: Self-pay | Source: Ambulatory Visit | Attending: Interventional Cardiology | Admitting: Interventional Cardiology

## 2014-01-17 ENCOUNTER — Encounter (HOSPITAL_COMMUNITY): Payer: Self-pay

## 2014-01-20 ENCOUNTER — Encounter (HOSPITAL_COMMUNITY): Payer: Self-pay

## 2014-01-21 ENCOUNTER — Encounter (HOSPITAL_COMMUNITY): Payer: Self-pay

## 2014-01-21 NOTE — Telephone Encounter (Signed)
F/u   Pt follow up previous message

## 2014-01-21 NOTE — Telephone Encounter (Signed)
pt aware that echo results are routed to Dr.Smith, I will cal pt back once reviwed by him.pt verbalized understanding.

## 2014-01-21 NOTE — Telephone Encounter (Signed)
Will route to Dr.Smith to review

## 2014-01-22 ENCOUNTER — Encounter (HOSPITAL_COMMUNITY)
Admission: RE | Admit: 2014-01-22 | Discharge: 2014-01-22 | Disposition: A | Discharge status: Home / Self Care | Payer: Self-pay | Source: Ambulatory Visit | Attending: Interventional Cardiology | Admitting: Interventional Cardiology

## 2014-01-22 NOTE — Telephone Encounter (Signed)
Message copied by Jarvis Newcomer on Wed Jan 22, 2014  2:37 PM ------      Message from: Verdis Prime      Created: Tue Jan 21, 2014  5:55 PM       Normal Bioprosthetic AV function and normal heart function ------

## 2014-01-22 NOTE — Telephone Encounter (Signed)
pt aware of echo results.Normal Bioprosthetic AV function and normal heart function.pt verbalized understanding.

## 2014-01-23 ENCOUNTER — Encounter (HOSPITAL_COMMUNITY): Payer: Self-pay

## 2014-01-24 ENCOUNTER — Encounter (HOSPITAL_COMMUNITY): Payer: Self-pay

## 2014-01-24 ENCOUNTER — Encounter (HOSPITAL_COMMUNITY)
Admission: RE | Admit: 2014-01-24 | Discharge: 2014-01-24 | Disposition: A | Discharge status: Home / Self Care | Payer: Self-pay | Source: Ambulatory Visit | Attending: Interventional Cardiology | Admitting: Interventional Cardiology

## 2014-01-27 ENCOUNTER — Encounter (HOSPITAL_COMMUNITY): Payer: Self-pay

## 2014-01-28 ENCOUNTER — Other Ambulatory Visit: Payer: Self-pay | Admitting: General Surgery

## 2014-01-28 ENCOUNTER — Telehealth: Payer: Self-pay | Admitting: Cardiology

## 2014-01-28 ENCOUNTER — Encounter (HOSPITAL_COMMUNITY): Payer: Self-pay

## 2014-01-28 DIAGNOSIS — G4733 Obstructive sleep apnea (adult) (pediatric): Secondary | ICD-10-CM

## 2014-01-28 NOTE — Telephone Encounter (Signed)
Will put in order for pt to have CPAP supplies. Waiting on response from [facility]

## 2014-01-28 NOTE — Telephone Encounter (Signed)
New message     Need CPAP machine renewed by [facility]---please call pt if there is a problem

## 2014-01-28 NOTE — Telephone Encounter (Signed)
Ordered

## 2014-01-29 ENCOUNTER — Encounter (HOSPITAL_COMMUNITY)
Admission: RE | Admit: 2014-01-29 | Discharge: 2014-01-29 | Disposition: A | Discharge status: Home / Self Care | Payer: Self-pay | Source: Ambulatory Visit | Attending: Interventional Cardiology | Admitting: Interventional Cardiology

## 2014-01-29 DIAGNOSIS — I359 Nonrheumatic aortic valve disorder, unspecified: Secondary | ICD-10-CM | POA: Insufficient documentation

## 2014-01-29 DIAGNOSIS — Z5189 Encounter for other specified aftercare: Secondary | ICD-10-CM | POA: Insufficient documentation

## 2014-01-29 DIAGNOSIS — I10 Essential (primary) hypertension: Secondary | ICD-10-CM | POA: Insufficient documentation

## 2014-01-30 ENCOUNTER — Encounter (HOSPITAL_COMMUNITY): Payer: Self-pay

## 2014-01-31 ENCOUNTER — Encounter (HOSPITAL_COMMUNITY)
Admission: RE | Admit: 2014-01-31 | Discharge: 2014-01-31 | Disposition: A | Discharge status: Home / Self Care | Payer: Self-pay | Source: Ambulatory Visit | Attending: Interventional Cardiology | Admitting: Interventional Cardiology

## 2014-01-31 ENCOUNTER — Encounter (HOSPITAL_COMMUNITY): Payer: Self-pay

## 2014-02-04 ENCOUNTER — Encounter (HOSPITAL_COMMUNITY): Payer: Self-pay

## 2014-02-05 ENCOUNTER — Encounter (HOSPITAL_COMMUNITY)
Admission: RE | Admit: 2014-02-05 | Discharge: 2014-02-05 | Disposition: A | Discharge status: Home / Self Care | Payer: Self-pay | Source: Ambulatory Visit | Attending: Interventional Cardiology | Admitting: Interventional Cardiology

## 2014-02-06 ENCOUNTER — Encounter (HOSPITAL_COMMUNITY): Payer: Self-pay

## 2014-02-07 ENCOUNTER — Encounter (HOSPITAL_COMMUNITY): Payer: Self-pay

## 2014-02-07 ENCOUNTER — Encounter (HOSPITAL_COMMUNITY)
Admission: RE | Admit: 2014-02-07 | Discharge: 2014-02-07 | Disposition: A | Discharge status: Home / Self Care | Payer: Self-pay | Source: Ambulatory Visit | Attending: Interventional Cardiology | Admitting: Interventional Cardiology

## 2014-02-10 ENCOUNTER — Encounter (HOSPITAL_COMMUNITY): Payer: Self-pay

## 2014-02-11 ENCOUNTER — Encounter (HOSPITAL_COMMUNITY): Payer: Self-pay

## 2014-02-12 ENCOUNTER — Telehealth: Payer: Self-pay

## 2014-02-12 ENCOUNTER — Encounter (HOSPITAL_COMMUNITY): Payer: Self-pay

## 2014-02-12 NOTE — Telephone Encounter (Signed)
pt aware of cardical monitor results -No Afib pt verbalized understanding.

## 2014-02-13 ENCOUNTER — Encounter (HOSPITAL_COMMUNITY): Payer: Self-pay

## 2014-02-14 ENCOUNTER — Encounter (HOSPITAL_COMMUNITY)
Admission: RE | Admit: 2014-02-14 | Discharge: 2014-02-14 | Disposition: A | Discharge status: Home / Self Care | Payer: Self-pay | Source: Ambulatory Visit | Attending: Interventional Cardiology | Admitting: Interventional Cardiology

## 2014-02-14 ENCOUNTER — Encounter (HOSPITAL_COMMUNITY): Payer: Self-pay

## 2014-02-17 ENCOUNTER — Encounter (HOSPITAL_COMMUNITY): Admission: RE | Admit: 2014-02-17 | Payer: Medicare Other | Source: Ambulatory Visit

## 2014-02-18 ENCOUNTER — Encounter (HOSPITAL_COMMUNITY): Payer: Self-pay

## 2014-02-19 ENCOUNTER — Encounter (HOSPITAL_COMMUNITY)
Admission: RE | Admit: 2014-02-19 | Discharge: 2014-02-19 | Disposition: A | Discharge status: Home / Self Care | Payer: Self-pay | Source: Ambulatory Visit | Attending: Interventional Cardiology | Admitting: Interventional Cardiology

## 2014-02-20 ENCOUNTER — Encounter (HOSPITAL_COMMUNITY): Payer: Self-pay

## 2014-02-21 ENCOUNTER — Encounter (HOSPITAL_COMMUNITY): Payer: Self-pay

## 2014-02-21 ENCOUNTER — Encounter (HOSPITAL_COMMUNITY)
Admission: RE | Admit: 2014-02-21 | Discharge: 2014-02-21 | Disposition: A | Discharge status: Home / Self Care | Payer: Self-pay | Source: Ambulatory Visit | Attending: Interventional Cardiology | Admitting: Interventional Cardiology

## 2014-02-24 ENCOUNTER — Encounter (HOSPITAL_COMMUNITY): Payer: Self-pay

## 2014-02-25 ENCOUNTER — Encounter (HOSPITAL_COMMUNITY): Payer: Self-pay

## 2014-02-26 ENCOUNTER — Encounter (HOSPITAL_COMMUNITY)
Admission: RE | Admit: 2014-02-26 | Discharge: 2014-02-26 | Disposition: A | Discharge status: Home / Self Care | Payer: Self-pay | Source: Ambulatory Visit | Attending: Interventional Cardiology | Admitting: Interventional Cardiology

## 2014-02-28 ENCOUNTER — Encounter (HOSPITAL_COMMUNITY)
Admission: RE | Admit: 2014-02-28 | Discharge: 2014-02-28 | Disposition: A | Discharge status: Home / Self Care | Payer: Self-pay | Source: Ambulatory Visit | Attending: Interventional Cardiology | Admitting: Interventional Cardiology

## 2014-02-28 DIAGNOSIS — I358 Other nonrheumatic aortic valve disorders: Secondary | ICD-10-CM | POA: Insufficient documentation

## 2014-02-28 DIAGNOSIS — Z5189 Encounter for other specified aftercare: Secondary | ICD-10-CM | POA: Insufficient documentation

## 2014-02-28 DIAGNOSIS — I10 Essential (primary) hypertension: Secondary | ICD-10-CM | POA: Insufficient documentation

## 2014-03-03 ENCOUNTER — Encounter (HOSPITAL_COMMUNITY)
Admission: RE | Admit: 2014-03-03 | Discharge: 2014-03-03 | Disposition: A | Discharge status: Home / Self Care | Payer: Self-pay | Source: Ambulatory Visit | Attending: Interventional Cardiology | Admitting: Interventional Cardiology

## 2014-03-05 ENCOUNTER — Encounter (HOSPITAL_COMMUNITY)
Admission: RE | Admit: 2014-03-05 | Discharge: 2014-03-05 | Disposition: A | Discharge status: Home / Self Care | Payer: Self-pay | Source: Ambulatory Visit | Attending: Interventional Cardiology | Admitting: Interventional Cardiology

## 2014-03-07 ENCOUNTER — Encounter (HOSPITAL_COMMUNITY): Payer: Self-pay

## 2014-03-10 ENCOUNTER — Encounter (HOSPITAL_COMMUNITY): Payer: Self-pay

## 2014-03-11 ENCOUNTER — Telehealth: Payer: Self-pay | Admitting: *Deleted

## 2014-03-11 NOTE — Telephone Encounter (Signed)
Pt states that he keeps getting a statement that he never returned the heart monitor that he worse for 30 days. Pt states he put everything in the box and dropped it off at the front desk [address] location. He would like a call back to let him know what he needs to do.

## 2014-03-12 ENCOUNTER — Encounter (HOSPITAL_COMMUNITY)
Admission: RE | Admit: 2014-03-12 | Discharge: 2014-03-12 | Disposition: A | Discharge status: Home / Self Care | Payer: Self-pay | Source: Ambulatory Visit | Attending: Interventional Cardiology | Admitting: Interventional Cardiology

## 2014-03-13 ENCOUNTER — Telehealth: Payer: Self-pay | Admitting: *Deleted

## 2014-03-13 NOTE — Telephone Encounter (Signed)
Tried to contact patient regarding his previous call.  I spoke with [employer], they do not have his monitor back to date.  They shipped out a new mailing bag on 02/27/2014 to [place] to replace the one that was originally in the cardiac event monitor kit.  This shipping bag was necessary to mail the monitor back.  The monitor was sent out upon arrival of the shipping bag.

## 2014-03-14 ENCOUNTER — Encounter (HOSPITAL_COMMUNITY)
Admission: RE | Admit: 2014-03-14 | Discharge: 2014-03-14 | Disposition: A | Discharge status: Home / Self Care | Payer: Self-pay | Source: Ambulatory Visit | Attending: Interventional Cardiology | Admitting: Interventional Cardiology

## 2014-03-17 ENCOUNTER — Encounter (HOSPITAL_COMMUNITY): Payer: Self-pay

## 2014-03-19 ENCOUNTER — Encounter (HOSPITAL_COMMUNITY)
Admission: RE | Admit: 2014-03-19 | Discharge: 2014-03-19 | Disposition: A | Discharge status: Home / Self Care | Payer: Self-pay | Source: Ambulatory Visit | Attending: Interventional Cardiology | Admitting: Interventional Cardiology

## 2014-03-21 ENCOUNTER — Encounter (HOSPITAL_COMMUNITY): Payer: Self-pay

## 2014-03-24 ENCOUNTER — Encounter (HOSPITAL_COMMUNITY): Payer: Self-pay

## 2014-03-26 ENCOUNTER — Encounter (HOSPITAL_COMMUNITY): Payer: Self-pay

## 2014-03-28 ENCOUNTER — Encounter (HOSPITAL_COMMUNITY): Payer: Self-pay

## 2014-03-31 ENCOUNTER — Encounter (HOSPITAL_COMMUNITY): Payer: Self-pay

## 2014-03-31 DIAGNOSIS — Z5189 Encounter for other specified aftercare: Secondary | ICD-10-CM | POA: Insufficient documentation

## 2014-03-31 DIAGNOSIS — I358 Other nonrheumatic aortic valve disorders: Secondary | ICD-10-CM | POA: Insufficient documentation

## 2014-03-31 DIAGNOSIS — I10 Essential (primary) hypertension: Secondary | ICD-10-CM | POA: Insufficient documentation

## 2014-04-02 ENCOUNTER — Encounter (HOSPITAL_COMMUNITY): Payer: Self-pay

## 2014-04-04 ENCOUNTER — Encounter (HOSPITAL_COMMUNITY)
Admission: RE | Admit: 2014-04-04 | Discharge: 2014-04-04 | Disposition: A | Discharge status: Home / Self Care | Payer: Self-pay | Source: Ambulatory Visit | Attending: Interventional Cardiology | Admitting: Interventional Cardiology

## 2014-04-07 ENCOUNTER — Encounter (HOSPITAL_COMMUNITY)
Admission: RE | Admit: 2014-04-07 | Discharge: 2014-04-07 | Disposition: A | Discharge status: Home / Self Care | Payer: Self-pay | Source: Ambulatory Visit | Attending: Interventional Cardiology | Admitting: Interventional Cardiology

## 2014-04-09 ENCOUNTER — Encounter (HOSPITAL_COMMUNITY)
Admission: RE | Admit: 2014-04-09 | Discharge: 2014-04-09 | Disposition: A | Discharge status: Home / Self Care | Payer: Self-pay | Source: Ambulatory Visit | Attending: Interventional Cardiology | Admitting: Interventional Cardiology

## 2014-04-11 ENCOUNTER — Encounter (HOSPITAL_COMMUNITY)
Admission: RE | Admit: 2014-04-11 | Discharge: 2014-04-11 | Disposition: A | Discharge status: Home / Self Care | Payer: Self-pay | Source: Ambulatory Visit | Attending: Interventional Cardiology | Admitting: Interventional Cardiology

## 2014-04-14 ENCOUNTER — Encounter (HOSPITAL_COMMUNITY)
Admission: RE | Admit: 2014-04-14 | Discharge: 2014-04-14 | Disposition: A | Discharge status: Home / Self Care | Payer: Self-pay | Source: Ambulatory Visit | Attending: Interventional Cardiology | Admitting: Interventional Cardiology

## 2014-04-16 ENCOUNTER — Encounter (HOSPITAL_COMMUNITY)
Admission: RE | Admit: 2014-04-16 | Discharge: 2014-04-16 | Disposition: A | Discharge status: Home / Self Care | Payer: Self-pay | Source: Ambulatory Visit | Attending: Interventional Cardiology | Admitting: Interventional Cardiology

## 2014-04-18 ENCOUNTER — Encounter (HOSPITAL_COMMUNITY): Payer: Self-pay

## 2014-04-21 ENCOUNTER — Encounter (HOSPITAL_COMMUNITY)
Admission: RE | Admit: 2014-04-21 | Discharge: 2014-04-21 | Disposition: A | Discharge status: Home / Self Care | Payer: Self-pay | Source: Ambulatory Visit | Attending: Interventional Cardiology | Admitting: Interventional Cardiology

## 2014-04-23 ENCOUNTER — Encounter (HOSPITAL_COMMUNITY): Payer: Self-pay

## 2014-04-28 ENCOUNTER — Encounter (HOSPITAL_COMMUNITY)
Admission: RE | Admit: 2014-04-28 | Discharge: 2014-04-28 | Disposition: A | Discharge status: Home / Self Care | Payer: Self-pay | Source: Ambulatory Visit | Attending: Interventional Cardiology | Admitting: Interventional Cardiology

## 2014-04-30 ENCOUNTER — Encounter (HOSPITAL_COMMUNITY)
Admission: RE | Admit: 2014-04-30 | Discharge: 2014-04-30 | Disposition: A | Discharge status: Home / Self Care | Payer: Self-pay | Source: Ambulatory Visit | Attending: Interventional Cardiology | Admitting: Interventional Cardiology

## 2014-04-30 DIAGNOSIS — I10 Essential (primary) hypertension: Secondary | ICD-10-CM | POA: Insufficient documentation

## 2014-04-30 DIAGNOSIS — I358 Other nonrheumatic aortic valve disorders: Secondary | ICD-10-CM | POA: Insufficient documentation

## 2014-04-30 DIAGNOSIS — Z5189 Encounter for other specified aftercare: Secondary | ICD-10-CM | POA: Insufficient documentation

## 2014-05-02 ENCOUNTER — Encounter (HOSPITAL_COMMUNITY): Payer: Self-pay

## 2014-05-05 ENCOUNTER — Encounter (HOSPITAL_COMMUNITY): Payer: Self-pay

## 2014-05-07 ENCOUNTER — Encounter (HOSPITAL_COMMUNITY)
Admission: RE | Admit: 2014-05-07 | Discharge: 2014-05-07 | Disposition: A | Discharge status: Home / Self Care | Payer: Self-pay | Source: Ambulatory Visit | Attending: Interventional Cardiology | Admitting: Interventional Cardiology

## 2014-05-09 ENCOUNTER — Encounter (HOSPITAL_COMMUNITY)
Admission: RE | Admit: 2014-05-09 | Discharge: 2014-05-09 | Disposition: A | Discharge status: Home / Self Care | Payer: Self-pay | Source: Ambulatory Visit | Attending: Interventional Cardiology | Admitting: Interventional Cardiology

## 2014-05-12 ENCOUNTER — Encounter (HOSPITAL_COMMUNITY): Payer: Self-pay

## 2014-05-14 ENCOUNTER — Encounter (HOSPITAL_COMMUNITY): Payer: Self-pay

## 2014-05-16 ENCOUNTER — Encounter (HOSPITAL_COMMUNITY)
Admission: RE | Admit: 2014-05-16 | Discharge: 2014-05-16 | Disposition: A | Discharge status: Home / Self Care | Payer: Self-pay | Source: Ambulatory Visit | Attending: Interventional Cardiology | Admitting: Interventional Cardiology

## 2014-05-19 ENCOUNTER — Encounter (HOSPITAL_COMMUNITY)
Admission: RE | Admit: 2014-05-19 | Discharge: 2014-05-19 | Disposition: A | Discharge status: Home / Self Care | Payer: Medicare Other | Source: Ambulatory Visit | Attending: Interventional Cardiology | Admitting: Interventional Cardiology

## 2014-05-21 ENCOUNTER — Encounter (HOSPITAL_COMMUNITY): Payer: Self-pay

## 2014-05-26 ENCOUNTER — Encounter (HOSPITAL_COMMUNITY)
Admission: RE | Admit: 2014-05-26 | Discharge: 2014-05-26 | Disposition: A | Discharge status: Home / Self Care | Payer: Self-pay | Source: Ambulatory Visit | Attending: Interventional Cardiology | Admitting: Interventional Cardiology

## 2014-05-27 ENCOUNTER — Encounter: Payer: Self-pay | Admitting: Rehabilitative and Restorative Service Providers"

## 2014-05-27 ENCOUNTER — Ambulatory Visit: Payer: Medicare Other | Attending: Internal Medicine | Admitting: Rehabilitative and Restorative Service Providers"

## 2014-05-27 DIAGNOSIS — H8112 Benign paroxysmal vertigo, left ear: Secondary | ICD-10-CM | POA: Diagnosis not present

## 2014-05-27 NOTE — Therapy (Signed)
[facility] [address] [name], [place], [address] Phone: [phone]   Fax:  [phone]  Physical Therapy Evaluation  Patient Details  Name: Patrick Henson MRN: [id number] Date of Birth: 06/18/1940  Encounter Date: 05/27/2014      PT End of Session - 05/27/14 1456    Visit Number 1   Number of Visits 4   Date for PT Re-Evaluation 06/26/14   PT Start Time 1105   PT Stop Time 1145   PT Time Calculation (min) 40 min   Activity Tolerance Other (comment)  Pt limited by nausea with treatment      Past Medical History  Diagnosis Date  . Aortic stenosis     AVA .96 cm2.2011  . Diabetes mellitus   . Arthritis   . Obstructive sleep apnea   . Hypertension   . Hyperlipidemia   . Obesity (BMI 30-39.9)     Past Surgical History  Procedure Laterality Date  . Right hip placement      2007  . Left knee replacement      2006  . Appendectomy    . Tonsillectomy    . Femoral hernia repair    . Cataract extraction Bilateral     There were no vitals taken for this visit.  Visit Diagnosis:  BPPV (benign paroxysmal positional vertigo), left      Subjective Assessment - 05/27/14 1110    Symptoms The patient has h/o intermittent vertigo with turning in bed or moving his head quickly described as spinning sensation that lasts for seconds.  "If I go easy, I don't have it".     Patient Stated Goals Move without getting dizzy.   Currently in Pain? No/denies          [facility] - 05/27/14 1113    Assessment   Medical Diagnosis BPPV   Balance Screen   Has the patient fallen in the past 6 months No   Has the patient had a decrease in activity level because of a fear of falling?  Yes   Is the patient reluctant to leave their home because of a fear of falling?  No   Home Environment   Living Enviornment Private residence   Living Arrangements Spouse/significant other   Type of Home House   Home Access Stairs  to enter   Entrance Stairs-Number of Steps --  1            Vestibular Assessment - 05/27/14 1114    Type of Dizziness Spinning   Frequency of Dizziness --  regularly   Duration of Dizziness --  seconds   Aggravating Factors Turning head quickly;Rolling to right;Rolling to left;Supine to sit   Relieving Factors Lying supine   Occulomotor Alignment Normal   Spontaneous Absent   Gaze-induced Absent   Smooth Pursuits Intact   Saccades Intact   Dix-Hallpike Dix-Hallpike Right;Dix-Hallpike Left   Sidelying Test Sidelying Right;Sidelying Left   Horizontal Canal Testing Horizontal Canal Right;Horizontal Canal Left   Dix-Hallpike Right Symptoms No nystagmus  mild sensation of decreased focus reported   Dix-Hallpike Left Symptoms No nystagmus  however neck guarded significantly   Sidelying Right Symptoms No nystagmus   Sidelying Left Duration --  >20 seconds   Sidelying Left Symptoms Upbeat, left rotatory nystagmus  with significant subjective report of spinning   Horizontal Canal Right Symptoms Normal   Horizontal Canal Left Symptoms Normal  Vestibular Treatment/Exercise - 05/27/14 1135    Vestibular Treatment Provided Canalith Repositioning;Habituation   Canalith Repositioning Epley Manuever Left  x 1 rep with severe nausea   Number of Reps  1   Overall Response  --  nausea significant, could not tolerate re-check               PT Education - 05/27/14 1455    Education provided Yes   Education Details Handout from vestibular disorders association on BPPV   Person(s) Educated Patient;Spouse   Methods Explanation;Handout   Comprehension Verbalized understanding             PT Long Term Goals - 05/27/14 1458    PT LONG TERM GOAL #1   Title The patient will be negative for positional testing for BPPV.  Target date 06/26/2014   Time 4   Period Weeks   PT LONG TERM GOAL #2   Title The patient will be indep with HEP for self management  of BPPV.  Target date 06/26/2014   Time 4   Period Weeks               Plan - 05/27/14 1456    Clinical Impression Statement The patient is a 73 yo male presenting to outpatient physical therapy with L posterior canal BPPV.  He tolerated one repetition of Epley's maneuver and has nausea with treatment.  PT held further assessment/treatment today due to nausea.   Pt will benefit from skilled therapeutic intervention in order to improve on the following deficits Abnormal gait;Decreased mobility;Decreased balance   Rehab Potential Good   PT Frequency 1x / week   PT Duration 4 weeks   PT Treatment/Interventions Therapeutic activities;Gait training;Therapeutic exercise;Balance training;Neuromuscular re-education;Other (comment)  Canolith Repositioning   PT Next Visit Plan Recheck BPPV L posterior canal.  Treat as tolerated.   Consulted and Agree with Plan of Care Patient;Family member/caregiver   Family Member Consulted spouse          G-Codes - 05/27/14 1142    Functional Limitation Self care   Self Care Current Status [phone]([id number]) At least 20 percent but less than 40 percent impaired, limited or restricted   Self Care Goal Status ([id number]) At least 1 percent but less than 20 percent impaired, limited or restricted       Problem List Patient Active Problem List   Diagnosis Date Noted  . History of aortic valve replacement with bioprosthetic valve 12/31/2013  . Right bundle branch block 12/31/2013  . Paroxysmal atrial fibrillation 12/31/2013  . Obesity (BMI 30-39.9)   . Arthritis 07/25/2011  . Diabetes mellitus   . Hypertension   . Hyperlipidemia   . Obstructive sleep apnea      Thank you for the referral of this patient.    Pierre Cumpton, PT 05/27/2014, 2:59 PM  [facility] [address] [place], [place], [address] Phone: [phone]   Fax:  [phone]

## 2014-05-28 ENCOUNTER — Encounter (HOSPITAL_COMMUNITY): Payer: Self-pay

## 2014-06-02 ENCOUNTER — Encounter (HOSPITAL_COMMUNITY): Payer: BC Managed Care – PPO

## 2014-06-02 DIAGNOSIS — Z5189 Encounter for other specified aftercare: Secondary | ICD-10-CM | POA: Insufficient documentation

## 2014-06-02 DIAGNOSIS — I358 Other nonrheumatic aortic valve disorders: Secondary | ICD-10-CM | POA: Insufficient documentation

## 2014-06-02 DIAGNOSIS — I10 Essential (primary) hypertension: Secondary | ICD-10-CM | POA: Insufficient documentation

## 2014-06-04 ENCOUNTER — Encounter (HOSPITAL_COMMUNITY): Payer: Self-pay

## 2014-06-06 ENCOUNTER — Encounter (HOSPITAL_COMMUNITY)
Admission: RE | Admit: 2014-06-06 | Discharge: 2014-06-06 | Disposition: A | Discharge status: Home / Self Care | Payer: Self-pay | Source: Ambulatory Visit | Attending: Interventional Cardiology | Admitting: Interventional Cardiology

## 2014-06-09 ENCOUNTER — Encounter (HOSPITAL_COMMUNITY): Payer: Self-pay

## 2014-06-11 ENCOUNTER — Encounter (HOSPITAL_COMMUNITY)
Admission: RE | Admit: 2014-06-11 | Discharge: 2014-06-11 | Disposition: A | Discharge status: Home / Self Care | Payer: Self-pay | Source: Ambulatory Visit | Attending: Interventional Cardiology | Admitting: Interventional Cardiology

## 2014-06-13 ENCOUNTER — Encounter (HOSPITAL_COMMUNITY)
Admission: RE | Admit: 2014-06-13 | Discharge: 2014-06-13 | Disposition: A | Discharge status: Home / Self Care | Payer: Self-pay | Source: Ambulatory Visit | Attending: Interventional Cardiology | Admitting: Interventional Cardiology

## 2014-06-16 ENCOUNTER — Encounter (HOSPITAL_COMMUNITY): Payer: Self-pay

## 2014-06-18 ENCOUNTER — Encounter (HOSPITAL_COMMUNITY): Payer: Self-pay

## 2014-06-20 ENCOUNTER — Encounter (HOSPITAL_COMMUNITY): Payer: Self-pay

## 2014-06-23 ENCOUNTER — Other Ambulatory Visit: Payer: Self-pay | Admitting: Interventional Cardiology

## 2014-06-23 ENCOUNTER — Encounter (HOSPITAL_COMMUNITY): Payer: Self-pay

## 2014-06-25 ENCOUNTER — Encounter (HOSPITAL_COMMUNITY): Payer: Self-pay

## 2014-06-27 ENCOUNTER — Encounter (HOSPITAL_COMMUNITY): Payer: Self-pay

## 2014-06-30 ENCOUNTER — Encounter (HOSPITAL_COMMUNITY): Payer: BC Managed Care – PPO | Attending: Interventional Cardiology

## 2014-06-30 DIAGNOSIS — I10 Essential (primary) hypertension: Secondary | ICD-10-CM | POA: Insufficient documentation

## 2014-06-30 DIAGNOSIS — I358 Other nonrheumatic aortic valve disorders: Secondary | ICD-10-CM | POA: Insufficient documentation

## 2014-06-30 DIAGNOSIS — Z5189 Encounter for other specified aftercare: Secondary | ICD-10-CM | POA: Insufficient documentation

## 2014-07-02 ENCOUNTER — Encounter (HOSPITAL_COMMUNITY): Payer: Self-pay

## 2014-07-04 ENCOUNTER — Encounter (HOSPITAL_COMMUNITY): Payer: Self-pay

## 2014-07-07 ENCOUNTER — Encounter (HOSPITAL_COMMUNITY): Payer: Self-pay

## 2014-07-09 ENCOUNTER — Encounter (HOSPITAL_COMMUNITY): Payer: Self-pay

## 2014-07-11 ENCOUNTER — Encounter (HOSPITAL_COMMUNITY): Payer: Self-pay

## 2014-07-14 ENCOUNTER — Encounter (HOSPITAL_COMMUNITY): Payer: Self-pay

## 2014-07-16 ENCOUNTER — Encounter (HOSPITAL_COMMUNITY): Payer: Self-pay

## 2014-07-18 ENCOUNTER — Encounter (HOSPITAL_COMMUNITY): Payer: Self-pay

## 2014-07-21 ENCOUNTER — Encounter (HOSPITAL_COMMUNITY): Payer: Self-pay

## 2014-07-22 ENCOUNTER — Telehealth (HOSPITAL_COMMUNITY): Payer: Self-pay | Admitting: *Deleted

## 2014-07-23 ENCOUNTER — Encounter (HOSPITAL_COMMUNITY): Payer: Self-pay

## 2014-07-25 ENCOUNTER — Encounter (HOSPITAL_COMMUNITY): Payer: Self-pay

## 2014-07-28 ENCOUNTER — Encounter (HOSPITAL_COMMUNITY): Payer: Self-pay

## 2014-07-30 ENCOUNTER — Encounter (HOSPITAL_COMMUNITY): Payer: Self-pay

## 2014-08-01 ENCOUNTER — Encounter (HOSPITAL_COMMUNITY): Payer: Self-pay

## 2014-08-04 ENCOUNTER — Encounter (HOSPITAL_COMMUNITY): Payer: Self-pay

## 2014-08-06 ENCOUNTER — Encounter (HOSPITAL_COMMUNITY): Payer: Self-pay

## 2014-08-08 ENCOUNTER — Encounter (HOSPITAL_COMMUNITY): Payer: Self-pay

## 2014-08-11 ENCOUNTER — Encounter (HOSPITAL_COMMUNITY): Payer: Self-pay

## 2014-08-13 ENCOUNTER — Encounter (HOSPITAL_COMMUNITY): Payer: Self-pay

## 2014-08-15 ENCOUNTER — Encounter (HOSPITAL_COMMUNITY): Payer: Self-pay

## 2014-08-18 ENCOUNTER — Encounter (HOSPITAL_COMMUNITY): Payer: Self-pay

## 2014-08-20 ENCOUNTER — Encounter (HOSPITAL_COMMUNITY): Payer: Self-pay

## 2014-08-22 ENCOUNTER — Encounter (HOSPITAL_COMMUNITY): Payer: Self-pay

## 2014-08-25 ENCOUNTER — Encounter (HOSPITAL_COMMUNITY): Payer: Self-pay

## 2014-08-27 ENCOUNTER — Encounter (HOSPITAL_COMMUNITY): Payer: Self-pay

## 2014-10-08 ENCOUNTER — Ambulatory Visit
Payer: BC Managed Care – PPO | Attending: Internal Medicine | Admitting: Rehabilitative and Restorative Service Providers"

## 2014-10-08 DIAGNOSIS — R269 Unspecified abnormalities of gait and mobility: Secondary | ICD-10-CM | POA: Insufficient documentation

## 2014-10-08 DIAGNOSIS — R42 Dizziness and giddiness: Secondary | ICD-10-CM | POA: Diagnosis not present

## 2014-10-08 NOTE — Therapy (Signed)
[facility] [address] [place], [place], [address] Phone: [phone]   Fax:  [phone]  Physical Therapy Evaluation  Patient Details  Name: Patrick SiresJack G Michaelsen MRN: [id number] Date of Birth: 04/12/1941 Referring Provider:  Talmage CoinKerr, Jeffrey, MD  Encounter Date: 10/08/2014      PT End of Session - 10/08/14 1401    Visit Number 1   Number of Visits 4   Date for PT Re-Evaluation 11/08/14   PT Start Time 0805   PT Stop Time 0850   PT Time Calculation (min) 45 min   Activity Tolerance Patient tolerated treatment well   Behavior During Therapy [facility] for tasks assessed/performed      Past Medical History  Diagnosis Date  . Aortic stenosis     AVA .96 cm2.2011  . Diabetes mellitus   . Arthritis   . Obstructive sleep apnea   . Hypertension   . Hyperlipidemia   . Obesity (BMI 30-39.9)     Past Surgical History  Procedure Laterality Date  . Right hip placement      2007  . Left knee replacement      2006  . Appendectomy    . Tonsillectomy    . Femoral hernia repair    . Cataract extraction Bilateral     There were no vitals filed for this visit.  Visit Diagnosis:  Abnormality of gait  Dizziness and giddiness      Subjective Assessment - 10/08/14 0808    Subjective The patient reports no vertigo since PT eval and treatment in 04/2014.  One week ago he woke up with room spining sensation that lasted for hours "dizzy or punchy most of the morning".  Pt reports things did not feel stable after rolling in bed.  Yesterday, the patient had an episode where he was driving and noted environmental movement.  Denies nausea/vomitting, hearing changes, no headaches, no recent increase in BP (checks every morning).  His blood sugar was high last week as well.    Pertinent History h/o BPPV   Patient Stated Goals Decrease dizziness.            [facility] - 10/08/14 0813    Assessment   Medical Diagnosis BPPV   Onset Date --  09/2013   Prior Therapy OP rehab   Balance Screen   Has the patient fallen in the past 6 months No   Has the patient had a decrease in activity level because of a fear of falling?  No   Is the patient reluctant to leave their home because of a fear of falling?  No   Home Environment   Living Enviornment Private residence   Living Arrangements Spouse/significant other   Type of Home House   Home Access Stairs to enter   Entrance Stairs-Number of Steps 1   Ambulation/Gait   Ambulation/Gait Yes   Ambulation/Gait Assistance 7: Independent   Ambulation Distance (Feet) 175 Feet   Gait Pattern --  orthopedic issues lead to antalgic gait   Ambulation Surface Level   Gait velocity 3.44 ft/sec   Standardized Balance Assessment   Standardized Balance Assessment Berg Balance Test;Dynamic Gait Index   Berg Balance Test   Sit to Stand Able to stand without using hands and stabilize independently   Standing Unsupported Able to stand safely 2 minutes   Sitting with Back Unsupported but Feet Supported on Floor or Stool Able to sit safely and securely 2 minutes   Stand to Sit  Sits safely with minimal use of hands   Transfers Able to transfer safely, minor use of hands   Standing Unsupported with Eyes Closed Able to stand 10 seconds safely   Standing Ubsupported with Feet Together Able to place feet together independently and stand 1 minute safely   From Standing, Reach Forward with Outstretched Arm Can reach forward >12 cm safely (5")   From Standing Position, Pick up Object from Floor Able to pick up shoe, needs supervision   From Standing Position, Turn to Look Behind Over each Shoulder Looks behind from both sides and weight shifts well   Turn 360 Degrees Needs close supervision or verbal cueing   Standing Unsupported, Alternately Place Feet on Step/Stool Able to complete 4 steps without aid or supervision   Standing Unsupported, One Foot in Front Able to plae foot ahead of the  other independently and hold 30 seconds   Standing on One Leg Tries to lift leg/unable to hold 3 seconds but remains standing independently   Total Score 45   Dynamic Gait Index   Level Surface Mild Impairment   Change in Gait Speed Normal   Gait with Horizontal Head Turns Mild Impairment   Gait with Vertical Head Turns Normal   Gait and Pivot Turn Normal   Step Over Obstacle Mild Impairment   Step Around Obstacles Normal   Steps Mild Impairment   Total Score 20            Vestibular Assessment - 10/08/14 0813    Symptom Behavior   Type of Dizziness --  Eyes won't focus   Frequency of Dizziness --  intermittent   Duration of Dizziness --  minutes   Occulomotor Exam   Occulomotor Alignment Normal   Spontaneous Absent   Gaze-induced Absent   Smooth Pursuits Intact   Saccades --  overshooting with saccades   Vestibulo-Occular Reflex   VOR 1 Head Only (x 1 viewing) tightness in neck limits testing   VOR to Slow Head Movement --  Small ROM able to maintain gaze   Positional Testing   Dix-Hallpike Dix-Hallpike Right;Dix-Hallpike Left   Sidelying Test Sidelying Right;Sidelying Left   Horizontal Canal Testing Horizontal Canal Right;Horizontal Canal Left   Dix-Hallpike Right   Dix-Hallpike Right Symptoms No nystagmus   Dix-Hallpike Left   Dix-Hallpike Left Symptoms No nystagmus   Sidelying Right   Sidelying Right Symptoms No nystagmus   Sidelying Left   Sidelying Left Symptoms No nystagmus   Horizontal Canal Right   Horizontal Canal Right Symptoms Normal   Horizontal Canal Left   Horizontal Canal Left Symptoms Normal             PT Long Term Goals - 10/08/14 1402    PT LONG TERM GOAL #1   Title The patient will be indep with high level balance HEP and gaze adaptation exercises.   Baseline Target date 11/08/2014   Time 4   Period Weeks   PT LONG TERM GOAL #2   Title The patient will return demo self treatment of BPPV if indicated.   Baseline Target date  11/08/2014   Time 4   Period Weeks   PT LONG TERM GOAL #3   Title The patient will be further assessed on SOT and goal to follow, if indicated.   Baseline Target date 11/08/2014   Time 4   Period Weeks               Plan - 10/08/14 1403  Clinical Impression Statement The patient is a 74 yo male iwth h/o BPPV sucessfully treated in our clinic.  At today's visit, he does not have positive positional testing for vertigo, however does have mild report of diziness and "hard to focus" with gait with head turns and gaze adaptation activities.   Pt will benefit from skilled therapeutic intervention in order to improve on the following deficits Abnormal gait;Decreased mobility;Decreased balance;Other (comment)  vestibular rehab   Rehab Potential Good   PT Frequency 1x / week   PT Duration 4 weeks   PT Treatment/Interventions Therapeutic activities;Gait training;Therapeutic exercise;Balance training;Neuromuscular re-education;Other (comment)  vestibular rehab as indicated   PT Next Visit Plan Check SOT, develop HEP, check BPPV if indicated.   Consulted and Agree with Plan of Care Patient;Family member/caregiver   Family Member Consulted spouse          G-Codes - Oct 23, 2014 1405    Functional Limitation Self care   Self Care Current Status [phone]) At least 20 percent but less than 40 percent impaired, limited or restricted   Self Care Goal Status (U2725) At least 1 percent but less than 20 percent impaired, limited or restricted       Problem List Patient Active Problem List   Diagnosis Date Noted  . History of aortic valve replacement with bioprosthetic valve 12/31/2013  . Right bundle branch block 12/31/2013  . Paroxysmal atrial fibrillation 12/31/2013  . Obesity (BMI 30-39.9)   . Arthritis 07/25/2011  . Diabetes mellitus   . Hypertension   . Hyperlipidemia   . Obstructive sleep apnea     Devri Kreher, PT Oct 23, 2014, 2:06 PM  [facility] [address] [place], [place], [address] Phone: [phone]   Fax:  [phone]

## 2014-10-16 ENCOUNTER — Ambulatory Visit: Payer: BC Managed Care – PPO | Admitting: Rehabilitative and Restorative Service Providers"

## 2014-10-16 ENCOUNTER — Encounter: Payer: Self-pay | Admitting: Rehabilitative and Restorative Service Providers"

## 2014-10-16 DIAGNOSIS — R269 Unspecified abnormalities of gait and mobility: Secondary | ICD-10-CM | POA: Diagnosis not present

## 2014-10-16 DIAGNOSIS — R42 Dizziness and giddiness: Secondary | ICD-10-CM

## 2014-10-16 NOTE — Therapy (Signed)
[facility] [address] [place], [place], [address] Phone: [phone]   Fax:  [phone]  Physical Therapy Treatment  Patient Details  Name: Patrick SiresJack G Ida MRN: [id number] Date of Birth: 02/03/1941 Referring Provider:  Catha GosselinLittle, Kevin, MD  Encounter Date: 10/16/2014      PT End of Session - 10/16/14 1130    Visit Number 2   Number of Visits 4   Date for PT Re-Evaluation 11/08/14   PT Start Time 0805   PT Stop Time 0845   PT Time Calculation (min) 40 min   Activity Tolerance Patient tolerated treatment well   Behavior During Therapy [facility] for tasks assessed/performed      Past Medical History  Diagnosis Date  . Aortic stenosis     AVA .96 cm2.2011  . Diabetes mellitus   . Arthritis   . Obstructive sleep apnea   . Hypertension   . Hyperlipidemia   . Obesity (BMI 30-39.9)     Past Surgical History  Procedure Laterality Date  . Right hip placement      2007  . Left knee replacement      2006  . Appendectomy    . Tonsillectomy    . Femoral hernia repair    . Cataract extraction Bilateral   . Aortic valve replacement      per patient reports[facility] and then had rehab    There were no vitals filed for this visit.  Visit Diagnosis:  Abnormality of gait  Dizziness and giddiness      Subjective Assessment - 10/16/14 0808    Subjective The patient's wife reports he has only c/o one episode of dizziness since evaluation-that was yesterday when looking up towards a cabinet door to read a posted note.  He experienced dizziness x minutes.     Currently in Pain? No/denies      NEUROMUSCULAR RE-EDUCATION: Sit<>stand x 10 reps *L knee does not bend to 90 degrees from h/o knee replacement, patient uses greater weight through R LE.  Sidestepping at countertop without UE support with cues   Gait: Ambulation x 3 minutes nonstop with cues on arm swing and bilateral heel strike Direction  changes with gait anterior/posterior and start/stops Attempted heel/toe walking with minA with loss of balance and difficulty  THERAPEUTIC EXERCISE: Standing posture scapular retraction x 10 reps in "w" position Seated long arc quads x 10 reps (pt experiences pain when he flexesL  knee and is limited in ROM), R knee x 10 reps Seated ankle pumps with L and then R knee extended SLRs x 10 reps L LE, R LE Seated hamstring stretching   SELF CARE/HOME MANAGEMENT: PT and patient discussed memory issues- the patient defers to his wife for all subjective questions and when asked further about memory, he states he is having declining short term memory. PT discussed with patient and his wife and his wife reports she has mentioned this at MD visits, but no treatment/further assessment recommended.  She reported this has been an ongoing concern of hers over the past 2 years and the patient is not interacting as often with family/friends due to this issue. PT recommended he f/u with MD for further discussion as it appears to be hindering his engagement and he continues to note a decline in memory.           PT Education - 10/16/14 1123    Education provided Yes   Education Details HEP: supine knee  flexion stretch, straight leg raise, sit<>stand, "W" exercise, sidestepping, and home walking program (with supervision from wife)   Person(s) Educated Patient;Spouse   Methods Explanation;Handout;Demonstration   Comprehension Verbalized understanding             PT Long Term Goals - 10/08/14 1402    PT LONG TERM GOAL #1   Title The patient will be indep with high level balance HEP and gaze adaptation exercises.   Baseline Target date 11/08/2014   Time 4   Period Weeks   PT LONG TERM GOAL #2   Title The patient will return demo self treatment of BPPV if indicated.   Baseline Target date 11/08/2014   Time 4   Period Weeks   PT LONG TERM GOAL #3   Title The patient will be further assessed on  SOT and goal to follow, if indicated.   Baseline Target date 11/08/2014   Time 4   Period Weeks               Plan - 10/16/14 1130    Clinical Impression Statement The patient is still not experiencing dizziness consistent with BPPV.  His mobility is decling per patient and wife report.  With further training, his L knee has decreased ROM to 89 degrees flexion limiting position with sit>stand.  PT provided general mobility HEP for L knee ROM and strength, sit<>stand, and balance activities.  I also recommended he perform home walking to increase his general activity level.  The patient appears to have cognitive/memory changes that his wife report have been present x 2 years.  PT recommended he f/u with MD re: his concerns.   PT Next Visit Plan Check HEP, check BPPV if indicated, gait training/mobility training, d/c plan to ?cardiac rehab for community exercise (patient was attending prior to february and enjoyed maintenance program)   Consulted and Agree with Plan of Care Patient;Family member/caregiver   Family Member Consulted spouse        Problem List Patient Active Problem List   Diagnosis Date Noted  . History of aortic valve replacement with bioprosthetic valve 12/31/2013  . Right bundle branch block 12/31/2013  . Paroxysmal atrial fibrillation 12/31/2013  . Obesity (BMI 30-39.9)   . Arthritis 07/25/2011  . Diabetes mellitus   . Hypertension   . Hyperlipidemia   . Obstructive sleep apnea     Latara Micheli, PT 10/16/2014, 11:49 AM  [facility] [address] [place], [place], [address] Phone: [phone]   Fax:  [phone]

## 2014-10-16 NOTE — Patient Instructions (Signed)
Knee to Chest (Flexion)   Pull LEFT knee toward chest. Feel stretch in your left knee. Breathing deeply, Hold __20__ seconds.  Repeat __3__ times. Do __1-2__ sessions per day.  http://gt2.exer.us/225   Copyright  VHI. All rights reserved.  Straight Leg Raise   Tighten stomach and slowly raise locked right leg _8-12___ inches from the bed. Repeat __10__ times per set. Do __1__ sets per session. Do _1-2___ sessions per day.  http://orth.exer.us/1102   Copyright  VHI. All rights reserved.  Functional Quadriceps: Sit to Stand   USE HANDS FOR SUPPORT.  Sit on edge of chair, feet flat on floor. Stand upright BE SURE TO STAND UP TALL, extending knees fully. Repeat __10__ times per set. Do __2__ sets per session. Do __1-2__ sessions per day.  http://orth.exer.us/734   Copyright  VHI. All rights reserved.  Side-Stepping    Near countertop * Walk to left side with eyes open. Take even steps, leading with same foot. Make sure each foot lifts off the floor. Repeat in opposite direction. Do along countertop for safety, length of counter 4 times up/down. Copyright  VHI. All rights reserved.   Scapular Retraction: Abduction (Standing)   Standing up:  With arms elevated and elbows bent to 90, pinch shoulder blades together and press arms back. Repeat __10__ times per set. Do __1__ sets per session. Do __1-2__ sessions per day.  http://orth.exer.us/950   Copyright  VHI. All rights reserved.   Walking Program:  Begin walking for exercise for 5 minutes, 2 times/day, 5 days/week.   Progress your walking program by adding 1-2 minutes to your routine each week, as tolerated. Be sure to wear good walking shoes, walk in a safe environment and only progress to your tolerance.

## 2014-10-24 ENCOUNTER — Ambulatory Visit: Payer: BC Managed Care – PPO | Admitting: Rehabilitative and Restorative Service Providers"

## 2014-10-24 ENCOUNTER — Encounter: Payer: Self-pay | Admitting: Rehabilitative and Restorative Service Providers"

## 2014-10-24 DIAGNOSIS — R269 Unspecified abnormalities of gait and mobility: Secondary | ICD-10-CM | POA: Diagnosis not present

## 2014-10-24 DIAGNOSIS — R42 Dizziness and giddiness: Secondary | ICD-10-CM

## 2014-10-24 NOTE — Patient Instructions (Signed)
SINGLE LIMB STANCE   Stance: single leg on floor. Raise leg. Hold _10__ seconds. Repeat with other leg. _3__ reps, 1-2x/day. NEAR COUNTERTOP FOR SAFETY  Copyright  VHI. All rights reserved.  Marching In-Place   Standing straight, alternate bringing knees toward trunk. Arms swing alternately. Do 10 times.  Do _1-2__ times per day.  Copyright  VHI. All rights reserved.

## 2014-10-24 NOTE — Therapy (Signed)
[place] [address] [place], [place], [address] Phone: [phone]   Fax:  [phone]  Patient Details  Name: Patrick Henson MRN: [id number] Date of Birth: 1940-12-08 Referring Provider:  No ref. provider found  Encounter Date: 10/24/2014  PHYSICAL THERAPY DISCHARGE SUMMARY  Visits from Start of Care: 3  Current functional level related to goals / functional outcomes:     PT Long Term Goals - 10/24/14 1247    PT LONG TERM GOAL #1   Title The patient will be indep with high level balance HEP and gaze adaptation exercises.   Baseline Met on 10/24/2014   Time 4   Period Weeks   Status Achieved   PT LONG TERM GOAL #2   Title The patient will return demo self treatment of BPPV if indicated.   Baseline Pt did not demo signs/symptoms at this time consistent with BPPV   Time 4   Period Weeks   Status Deferred   PT LONG TERM GOAL #3   Title The patient will be further assessed on SOT and goal to follow, if indicated.   Baseline Modified goal to perform Berg balance score with patient improving from 45/56 up to 53/56.   Time 4   Period Weeks   Status Deferred        Remaining deficits: H/o L knee replacement with chronic ROM deficits Postural weakness addressed through HEP Decreased high level balance addressed through [facility] / Equipment: Community exercise, fall prevention, HEP  Plan: Patient agrees to discharge.  Patient goals were partially met. Patient is being discharged due to meeting the stated rehab goals.  ?????Thank you for the referral of this patient.          Hershey Knauer 10/24/2014, 4:46 PM  [facility] [address] [place], [place], [address] Phone: [phone]   Fax:  [phone]

## 2014-10-24 NOTE — Therapy (Signed)
[facility] [address] [place], [place], [address] Phone: [phone]   Fax:  [phone]  Physical Therapy Treatment  Patient Details  Name: Patrick Henson MRN: [id number] Date of Birth: 1941-04-10 Referring Provider:  Dr. Delrae Rend, MD  Encounter Date: 10/24/2014      PT End of Session - 10/24/14 1247    Visit Number 3   Number of Visits 4   Date for PT Re-Evaluation 11/08/14   PT Start Time 9702   PT Stop Time 1315   PT Time Calculation (min) 40 min   Activity Tolerance Patient tolerated treatment well   Behavior During Therapy [facility] for tasks assessed/performed      Past Medical History  Diagnosis Date  . Aortic stenosis     AVA .96 cm2.2011  . Diabetes mellitus   . Arthritis   . Obstructive sleep apnea   . Hypertension   . Hyperlipidemia   . Obesity (BMI 30-39.9)     Past Surgical History  Procedure Laterality Date  . Right hip placement      2007  . Left knee replacement      2006  . Appendectomy    . Tonsillectomy    . Femoral hernia repair    . Cataract extraction Bilateral   . Aortic valve replacement      per patient reports, performed at [facility] in 2013 and then had rehab    There were no vitals filed for this visit.  Visit Diagnosis:  Abnormality of gait  Dizziness and giddiness      Subjective Assessment - 10/24/14 1235    Subjective The patient reports no episodes of dizziness since last session.  He performed HEP intermittently due to traveling to the beach with family.   Currently in Pain? No/denies      Gait: Dynamic gait with direction changes anteriorly/posteriorly with CGA Gait with emphasis on posture, arm swing and stride length Sidestepping NEUROMUSCULAR RE-EDUCATION: Single limb stance activities Marching with UE support SELF CARE/HOME MANAGEMENT: Progression of home exercises after d/c Availability of silver sneakers and AHOY program for exercise       [facility]  PT Assessment - 10/24/14 1256    Berg Balance Test   Sit to Stand Able to stand without using hands and stabilize independently   Standing Unsupported Able to stand safely 2 minutes   Sitting with Back Unsupported but Feet Supported on Floor or Stool Able to sit safely and securely 2 minutes   Stand to Sit Sits safely with minimal use of hands   Transfers Able to transfer safely, minor use of hands   Standing Unsupported with Eyes Closed Able to stand 10 seconds safely   Standing Ubsupported with Feet Together Able to place feet together independently and stand 1 minute safely   From Standing, Reach Forward with Outstretched Arm Can reach confidently >25 cm (10")   From Standing Position, Pick up Object from Floor Able to pick up shoe safely and easily   From Standing Position, Turn to Look Behind Over each Shoulder Looks behind from both sides and weight shifts well   Turn 360 Degrees Able to turn 360 degrees safely in 4 seconds or less   Standing Unsupported, Alternately Place Feet on Step/Stool Able to stand independently and safely and complete 8 steps in 20 seconds   Standing Unsupported, One Foot in Front Able to plae foot ahead of the other independently and hold 30 seconds   Standing on One  Leg Able to lift leg independently and hold equal to or more than 3 seconds   Total Score 53              PT Education - 10/24/14 1304    Education provided Yes   Education Details HEP: single leg stance, marching   Person(s) Educated Patient;Spouse   Methods Explanation;Demonstration;Handout   Comprehension Returned demonstration;Verbalized understanding             PT Long Term Goals - 10/24/14 1247    PT LONG TERM GOAL #1   Title The patient will be indep with high level balance HEP and gaze adaptation exercises.   Baseline Met on 10/24/2014   Time 4   Period Weeks   Status Achieved   PT LONG TERM GOAL #2   Title The patient will return demo self treatment of BPPV if  indicated.   Baseline Pt did not demo signs/symptoms at this time consistent with BPPV   Time 4   Period Weeks   Status Deferred   PT LONG TERM GOAL #3   Title The patient will be further assessed on SOT and goal to follow, if indicated.   Baseline Modified goal to perform Berg balance score with patient improving from 45/56 up to 53/56.   Time 4   Period Weeks   Status Deferred               Plan - 10/24/14 1640    Clinical Impression Statement The patient is independent with HEP, has information re: community resources, and reports no vertigo at this time.  PT recommended he continue exercising and plan to attend community classes for long term exercise.   PT Next Visit Plan d/c from physical therapy   Consulted and Agree with Plan of Care Patient;Family member/caregiver   Family Member Consulted spouse        Problem List Patient Active Problem List   Diagnosis Date Noted  . History of aortic valve replacement with bioprosthetic valve 12/31/2013  . Right bundle branch block 12/31/2013  . Paroxysmal atrial fibrillation 12/31/2013  . Obesity (BMI 30-39.9)   . Arthritis 07/25/2011  . Diabetes mellitus   . Hypertension   . Hyperlipidemia   . Obstructive sleep apnea     Klaus Casteneda, PT 10/24/2014, 4:41 PM  [facility] [address] [place], [place], [address] Phone: [phone]   Fax:  [phone]

## 2014-11-10 ENCOUNTER — Ambulatory Visit: Payer: BC Managed Care – PPO | Admitting: Rehabilitative and Restorative Service Providers"

## 2014-12-28 ENCOUNTER — Other Ambulatory Visit: Payer: Self-pay | Admitting: Interventional Cardiology

## 2014-12-30 ENCOUNTER — Other Ambulatory Visit: Payer: Self-pay | Admitting: Interventional Cardiology

## 2015-02-23 ENCOUNTER — Other Ambulatory Visit: Payer: Self-pay | Admitting: Interventional Cardiology

## 2015-03-26 ENCOUNTER — Other Ambulatory Visit: Payer: Self-pay | Admitting: Interventional Cardiology

## 2015-03-30 ENCOUNTER — Other Ambulatory Visit: Payer: Self-pay | Admitting: Interventional Cardiology

## 2015-05-13 ENCOUNTER — Ambulatory Visit: Payer: BC Managed Care – PPO | Admitting: Interventional Cardiology

## 2015-05-26 ENCOUNTER — Other Ambulatory Visit: Payer: Self-pay | Admitting: Interventional Cardiology

## 2015-06-08 ENCOUNTER — Ambulatory Visit: Payer: BC Managed Care – PPO | Admitting: Interventional Cardiology

## 2015-06-23 ENCOUNTER — Encounter: Payer: Self-pay | Admitting: Nurse Practitioner

## 2015-06-23 ENCOUNTER — Ambulatory Visit (INDEPENDENT_AMBULATORY_CARE_PROVIDER_SITE_OTHER): Payer: Medicare Other | Admitting: Nurse Practitioner

## 2015-06-23 VITALS — BP 150/80 | HR 69 | Ht 73.0 in | Wt 222.4 lb

## 2015-06-23 DIAGNOSIS — I453 Trifascicular block: Secondary | ICD-10-CM

## 2015-06-23 LAB — BASIC METABOLIC PANEL
BUN: 10 mg/dL (ref 7–25)
CO2: 31 mmol/L (ref 20–31)
Calcium: 9.2 mg/dL (ref 8.6–10.3)
Chloride: 103 mmol/L (ref 98–110)
Creat: 0.93 mg/dL (ref 0.70–1.18)
Glucose, Bld: 172 mg/dL — ABNORMAL HIGH (ref 65–99)
Potassium: 4.1 mmol/L (ref 3.5–5.3)
Sodium: 139 mmol/L (ref 135–146)

## 2015-06-23 MED ORDER — LISINOPRIL-HYDROCHLOROTHIAZIDE 10-12.5 MG PO TABS: 1.0000 | ORAL_TABLET | Freq: Every day | ORAL | Status: DC

## 2015-06-23 NOTE — Progress Notes (Signed)
CARDIOLOGY OFFICE NOTE  Date:  06/23/2015    Patrick Henson Date of Birth: 12-Oct-1940 Medical Record #[id number]  PCP:  Mickie Hillier, MD  Cardiologist:  Katrinka Blazing    No chief complaint on file.   History of Present Illness: Patrick Henson is a 75 y.o. male who presents today for a follow up visit. Seen for Dr. Katrinka Blazing.   He has a history of prior AVR at [facility] back in 2013, trifascicular block on EKG and HTN. No clear cut atrial fib. Negative event monitor from 2015.   Last seen in August of 2015.   Comes back today. Here alone. Off his blood pressure medicine - our office would not refill - he "figured something was wrong with the medicine" so did not follow thru for a visit until now. BP back up some. Feels fine. No chest pain. Not short of breath. Not dizzy or lightheaded. He will have some occasional vertigo - clearly related to moving his head. No syncope.   Past Medical History  Diagnosis Date  . Aortic stenosis     AVA .96 cm2.2011  . Diabetes mellitus   . Arthritis   . Obstructive sleep apnea   . Hypertension   . Hyperlipidemia   . Obesity (BMI 30-39.9)     Past Surgical History  Procedure Laterality Date  . Right hip placement      2007  . Left knee replacement      2006  . Appendectomy    . Tonsillectomy    . Femoral hernia repair    . Cataract extraction Bilateral   . Aortic valve replacement      per patient reports, performed at [facility] in 2013 and then had rehab     Medications: Current Outpatient Prescriptions  Medication Sig Dispense Refill  . amLODipine (NORVASC) 5 MG tablet TAKE 1 TABLET BY MOUTH DAILY 90 tablet 0  . atorvastatin (LIPITOR) 20 MG tablet Take 20 mg by mouth daily.    . finasteride (PROSCAR) 5 MG tablet Take 5 mg by mouth daily.    . metFORMIN (GLUCOPHAGE) 1000 MG tablet Take 1,000 mg by mouth daily with breakfast.     . metoprolol succinate (TOPROL-XL) 25 MG 24 hr tablet Take 25 mg by mouth daily.    Marland Kitchen aspirin EC 81 MG tablet Take  81 mg by mouth daily. Reported on 06/23/2015    . lisinopril-hydrochlorothiazide (PRINZIDE,ZESTORETIC) 10-12.5 MG tablet Take 1 tablet by mouth daily. 90 tablet 3   No current facility-administered medications for this visit.    Allergies: Allergies  Allergen Reactions  . Actos [Pioglitazone] Swelling    Social History: The patient  reports that he has never smoked. He has never used smokeless tobacco. He reports that he does not drink alcohol or use illicit drugs.   Family History: The patient's family history includes Diabetes in his mother; Heart attack in his mother; Heart disease in his mother.   Review of Systems: Please see the history of present illness.   Otherwise, the review of systems is positive for none.   All other systems are reviewed and negative.   Physical Exam: VS:  BP 150/80 mmHg  Pulse 69  Ht  (1.854 m)  Wt 222 lb 6.4 oz (100.88 kg)  BMI 29.35 kg/m2 .  BMI Body mass index is 29.35 kg/(m^2).  Wt Readings from Last 3 Encounters:  06/23/15 222 lb 6.4 oz (100.88 kg)  12/31/13 228 lb (103.42 kg)  04/11/13  230 lb (104.327 kg)    General: Pleasant. Elderly male who is alert and in no acute distress.  HEENT: Normal. Neck: Supple, no JVD, carotid bruits, or masses noted.  Cardiac: Regular rate and rhythm. He has an outflow murmur noted.  No edema.  Respiratory:  Lungs are clear to auscultation bilaterally with normal work of breathing.  GI: Soft and nontender.  MS: No deformity or atrophy. Gait and ROM intact. Skin: Warm and dry. Color is normal.  Neuro:  Strength and sensation are intact and no gross focal deficits noted.  Psych: Alert, appropriate and with normal affect.   LABORATORY DATA:  EKG:  EKG is ordered today. This demonstrates NSR with trifascicular block.  Lab Results  Component Value Date   GLUCOSE 130* 08/16/2013   NA 139 08/16/2013   K 5.0 08/16/2013   CL 102 08/16/2013   CREATININE 1.0 08/16/2013   BUN 15 08/16/2013   CO2 31  08/16/2013    BNP (last 3 results) No results for input(s): BNP in the last 8760 hours.  ProBNP (last 3 results) No results for input(s): PROBNP in the last 8760 hours.   Other Studies Reviewed Today:  Echo Study Conclusions from 12/2013  - Left ventricle: The cavity size was normal. Wall thickness was increased in a pattern of moderate LVH. Systolic function was normal. The estimated ejection fraction was in the range of 55% to 60%. - Aortic valve: Tissue AVR with mildly elevated systolic gradients and mild perivavlular regurgitation. - Mitral valve: Calcified annulus. Mildly thickened leaflets . There was mild regurgitation. - Left atrium: The atrium was moderately dilated. - Atrial septum: No defect or patent foramen ovale was identified.  Assessment/Plan: 1. HTN - off his Lisinopril HCT - restarting today. Check BMET  2. Prior AVR by Dr. Silvestre Mesi in 2013. Echo from 2015 noted - he has no cardinal symptoms - consider repeat study on return.   3. Trifascicular block on EKG - no syncope reported. Would follow.   Current medicines are reviewed with the patient today.  The patient does not have concerns regarding medicines other than what has been noted above.  The following changes have been made:  See above.  Labs/ tests ordered today include:    Orders Placed This Encounter  Procedures  . EKG 12-Lead     Disposition:   FU with Dr. Katrinka Blazing in 6 months.   Patient is agreeable to this plan and will call if any problems develop in the interim.   Signed: Rosalio Macadamia, RN, ANP-C 06/23/2015 10:47 AM  [facility] [address] [place], [place]  [address] Phone: [phone] Fax: [phone]

## 2015-06-23 NOTE — Addendum Note (Signed)
Addended by: Rosalio Macadamia on: 06/23/2015 11:02 AM   Modules accepted: Orders

## 2015-06-23 NOTE — Patient Instructions (Signed)
We will be checking the following labs today - BMET   Medication Instructions:    Continue with your current medicines.   I am restarting your Lisinopril HCT - this is at your drug store - please resume taking.     Testing/Procedures To Be Arranged:  N/A  Follow-Up:   See Dr. Katrinka Blazing in 6 months    Other Special Instructions:   N/A    If you need a refill on your cardiac medications before your next appointment, please call your pharmacy.   Call the [facility] office at [phone] if you have any questions, problems or concerns.

## 2015-07-04 ENCOUNTER — Other Ambulatory Visit: Payer: Self-pay | Admitting: Interventional Cardiology

## 2016-06-13 ENCOUNTER — Other Ambulatory Visit: Payer: Self-pay | Admitting: Nurse Practitioner

## 2016-06-26 ENCOUNTER — Other Ambulatory Visit: Payer: Self-pay | Admitting: Interventional Cardiology

## 2016-07-28 ENCOUNTER — Other Ambulatory Visit: Payer: Self-pay | Admitting: Interventional Cardiology

## 2016-08-03 ENCOUNTER — Ambulatory Visit (INDEPENDENT_AMBULATORY_CARE_PROVIDER_SITE_OTHER): Payer: Medicare Other | Admitting: Interventional Cardiology

## 2016-08-03 ENCOUNTER — Encounter: Payer: Self-pay | Admitting: Interventional Cardiology

## 2016-08-03 ENCOUNTER — Encounter (INDEPENDENT_AMBULATORY_CARE_PROVIDER_SITE_OTHER): Payer: Self-pay

## 2016-08-03 VITALS — BP 126/60 | HR 60 | Ht 73.0 in | Wt 224.0 lb

## 2016-08-03 DIAGNOSIS — E784 Other hyperlipidemia: Secondary | ICD-10-CM | POA: Diagnosis not present

## 2016-08-03 DIAGNOSIS — Z953 Presence of xenogenic heart valve: Secondary | ICD-10-CM | POA: Diagnosis not present

## 2016-08-03 DIAGNOSIS — I10 Essential (primary) hypertension: Secondary | ICD-10-CM

## 2016-08-03 DIAGNOSIS — I4510 Unspecified right bundle-branch block: Secondary | ICD-10-CM

## 2016-08-03 DIAGNOSIS — I480 Paroxysmal atrial fibrillation: Secondary | ICD-10-CM | POA: Diagnosis not present

## 2016-08-03 DIAGNOSIS — G4733 Obstructive sleep apnea (adult) (pediatric): Secondary | ICD-10-CM

## 2016-08-03 DIAGNOSIS — E7849 Other hyperlipidemia: Secondary | ICD-10-CM

## 2016-08-03 NOTE — Progress Notes (Signed)
Cardiology Office Note    Date:  08/03/2016   ID:  [name], DOB 03/14/1941, MRN [id number]  PCP:  Gennette Pac, MD  Cardiologist: Sinclair Grooms, MD   Chief Complaint  Patient presents with  . Cardiac Valve Problem    History of Present Illness:  Patrick Henson is a 76 y.o. male  with history of aortic valve replacement 2013 at [facility], hyperlipidemia, essential hypertension, conduction system disease with sinus bradycardia, fascicular block, and first-degree AV block.  He is doing well. He denies cardiac complaints. He seems a have some difficulty with memory. He does remember the date of his surgery. He denies palpitations and syncope. Overall, feels well from the cardiac standpoint.  Past Medical History:  Diagnosis Date  . Aortic stenosis    AVA .96 cm2.2011  . Arthritis   . Diabetes mellitus   . Hyperlipidemia   . Hypertension   . Obesity (BMI 30-39.9)   . Obstructive sleep apnea     Past Surgical History:  Procedure Laterality Date  . AORTIC VALVE REPLACEMENT     per patient reports, performed at [facility] in 2013 and then had rehab  . APPENDECTOMY    . CATARACT EXTRACTION Bilateral   . FEMORAL HERNIA REPAIR    . left knee replacement     2006  . right hip placement     2007  . TONSILLECTOMY      Current Medications: Outpatient Medications Prior to Visit  Medication Sig Dispense Refill  . amLODipine (NORVASC) 5 MG tablet TAKE 1 TABLET BY MOUTH DAILY 90 tablet 0  . aspirin EC 81 MG tablet Take 81 mg by mouth daily. Reported on 06/23/2015    . atorvastatin (LIPITOR) 20 MG tablet Take 20 mg by mouth daily.    . finasteride (PROSCAR) 5 MG tablet Take 5 mg by mouth daily.    Marland Kitchen lisinopril-hydrochlorothiazide (PRINZIDE,ZESTORETIC) 10-12.5 MG tablet TAKE 1 TABLET BY MOUTH ONCE DAILY 60 tablet 0  . metFORMIN (GLUCOPHAGE) 1000 MG tablet Take 1,000 mg by mouth daily with breakfast.     . metoprolol succinate (TOPROL-XL) 25 MG 24 hr tablet  Take 25 mg by mouth daily.    Marland Kitchen amLODipine (NORVASC) 5 MG tablet Take 1 tablet (5 mg total) by mouth daily. *Patient is overdue for an appointment and needs to call and schedule for further refills* (Patient not taking: Reported on 08/03/2016) 15 tablet 0   No facility-administered medications prior to visit.      Allergies:   Actos [pioglitazone]   Social History   Social History  . Marital status: Married    Spouse name: N/A  . Number of children: N/A  . Years of education: N/A   Social History Main Topics  . Smoking status: Never Smoker  . Smokeless tobacco: Never Used  . Alcohol use No  . Drug use: No  . Sexual activity: Not Asked   Other Topics Concern  . None   Social History Narrative  . None     Family History:  The patient's family history includes Diabetes in his mother; Heart attack in his mother; Heart disease in his mother.   ROS:   Please see the history of present illness.    Feels his C Pap device is not fit as well as he would like. Arthritis in his back and lower extremity joints.  All other systems reviewed and are negative.   PHYSICAL EXAM:   VS:  BP  126/60 (BP Location: Right Arm)   Pulse 60   Ht '6\' 1"'$  (1.854 m)   Wt 224 lb (101.6 kg)   BMI 29.55 kg/m    GEN: Well nourished, well developed, in no acute distress  HEENT: normal  Neck: no JVD, carotid bruits, or masses Cardiac: RRR; Scratchy 2/6 crescendo decrescendo systolic murmur at right upper sternal border. No rubs, or gallops,no edema  Respiratory:  clear to auscultation bilaterally, normal work of breathing GI: soft, nontender, nondistended, + BS MS: no deformity or atrophy  Skin: warm and dry, no rash Neuro:  Alert and Oriented x 3, Strength and sensation are intact Psych: euthymic mood, full affect  Wt Readings from Last 3 Encounters:  08/03/16 224 lb (101.6 kg)  06/23/15 222 lb 6.4 oz (100.9 kg)  12/31/13 228 lb (103.4 kg)      Studies/Labs Reviewed:   EKG:  EKG  Is  bradycardia, first-degree AV block with PR interval 258 ms, right bundle, left anterior hemiblock.  Recent Labs: No results found for requested labs within last 8760 hours.   Lipid Panel No results found for: CHOL, TRIG, HDL, CHOLHDL, VLDL, LDLCALC, LDLDIRECT  Additional studies/ records that were reviewed today include:  No recent functional testing or imaging    ASSESSMENT:    1. History of aortic valve replacement with bioprosthetic valve   2. Paroxysmal atrial fibrillation (HCC)   3. Essential hypertension   4. Other hyperlipidemia   5. Right bundle branch block   6. Obstructive sleep apnea      PLAN:  In order of problems listed above:  1. Soft systolic murmurs heard on auscultation. He has a bioprosthetic aortic valve. We should reassess with 2-D Doppler echo and compared to study performed 3 years ago. 2. No recurrence of atrial fibrillation based on clinical parameters. Currently in sinus rhythm based on EKG. 3. Well controlled blood pressure on his current regimen 4. Not addressed 5. No change noted and conduction pattern on EKG. In addition right bundle VS left anterior hemiblock. 6. We'll set up follow-up for met with his sleep physician, Dr. Radford Pax for follow-up.    Medication Adjustments/Labs and Tests Ordered: Current medicines are reviewed at length with the patient today.  Concerns regarding medicines are outlined above.  Medication changes, Labs and Tests ordered today are listed in the Patient Instructions below. There are no Patient Instructions on file for this visit.   Signed, Sinclair Grooms, MD  08/03/2016 8:34 AM    [facility] [place], [place], [place]  [address] Phone: [phone]; Fax: [phone]

## 2016-08-03 NOTE — Patient Instructions (Signed)
Medication Instructions:  None  Labwork: None  Testing/Procedures: Your physician has requested that you have an echocardiogram. Echocardiography is a painless test that uses sound waves to create images of your heart. It provides your doctor with information about the size and shape of your heart and how well your heart's chambers and valves are working. This procedure takes approximately one hour. There are no restrictions for this procedure.   Follow-Up: Your physician recommends that you schedule a follow-up appointment next available with Dr. Mayford Knifeurner for CPAP follow up.  Your physician wants you to follow-up in: 1 year with Dr. Katrinka BlazingSmith.  You will receive a reminder letter in the mail two months in advance. If you don't receive a letter, please call our office to schedule the follow-up appointment.    Any Other Special Instructions Will Be Listed Below (If Applicable).     If you need a refill on your cardiac medications before your next appointment, please call your pharmacy.

## 2016-08-16 ENCOUNTER — Other Ambulatory Visit: Payer: Self-pay | Admitting: Interventional Cardiology

## 2016-08-16 ENCOUNTER — Other Ambulatory Visit: Payer: Self-pay | Admitting: Nurse Practitioner

## 2016-08-19 ENCOUNTER — Ambulatory Visit (HOSPITAL_COMMUNITY): Payer: Medicare Other | Attending: Cardiology

## 2016-08-19 ENCOUNTER — Other Ambulatory Visit: Payer: Self-pay

## 2016-08-19 DIAGNOSIS — I348 Other nonrheumatic mitral valve disorders: Secondary | ICD-10-CM | POA: Diagnosis not present

## 2016-08-19 DIAGNOSIS — I351 Nonrheumatic aortic (valve) insufficiency: Secondary | ICD-10-CM | POA: Insufficient documentation

## 2016-08-19 DIAGNOSIS — Z953 Presence of xenogenic heart valve: Secondary | ICD-10-CM | POA: Diagnosis not present

## 2016-08-19 DIAGNOSIS — I517 Cardiomegaly: Secondary | ICD-10-CM | POA: Insufficient documentation

## 2016-09-02 ENCOUNTER — Encounter: Payer: Self-pay | Admitting: Cardiology

## 2016-09-19 ENCOUNTER — Encounter: Payer: Self-pay | Admitting: Cardiology

## 2016-09-20 ENCOUNTER — Ambulatory Visit (INDEPENDENT_AMBULATORY_CARE_PROVIDER_SITE_OTHER): Payer: Medicare Other | Admitting: Cardiology

## 2016-09-20 ENCOUNTER — Encounter: Payer: Self-pay | Admitting: Cardiology

## 2016-09-20 VITALS — BP 132/62 | HR 71 | Ht 73.0 in | Wt 216.1 lb

## 2016-09-20 DIAGNOSIS — E669 Obesity, unspecified: Secondary | ICD-10-CM | POA: Diagnosis not present

## 2016-09-20 DIAGNOSIS — I10 Essential (primary) hypertension: Secondary | ICD-10-CM | POA: Diagnosis not present

## 2016-09-20 DIAGNOSIS — G4733 Obstructive sleep apnea (adult) (pediatric): Secondary | ICD-10-CM | POA: Diagnosis not present

## 2016-09-20 NOTE — Patient Instructions (Signed)

## 2016-09-20 NOTE — Progress Notes (Signed)
Cardiology Office Note    Date:  09/20/2016   ID:  Patrick Henson, DOB 1940-09-05, MRN [id number]  PCP:  Mickie Hillier, MD  Cardiologist:  Armanda Magic, MD   Chief Complaint  Patient presents with  . Sleep Apnea  . Hypertension    History of Present Illness:  Patrick Henson is a 76 y.o. male with a history of OSA on CPAP, HTN and obesity who presents today for followup.  He is doing well.  He tolerates his CPAP device well.  He tolerates the nasal mask and feels the pressure is adequate.  He continues to feel rested in the am for the most part.  He occasionally will have to take a nap during the day. He denies any significant mouth or nasal dryness or nasal congestion.  He does not think that he snores.   Past Medical History:  Diagnosis Date  . Aortic stenosis    AVA .96 cm2.2011  . Arthritis   . Diabetes mellitus   . Hyperlipidemia   . Hypertension   . Obesity (BMI 30-39.9)   . Obstructive sleep apnea     Past Surgical History:  Procedure Laterality Date  . AORTIC VALVE REPLACEMENT     per patient reports, performed at [facility] in 2013 and then had rehab  . APPENDECTOMY    . CATARACT EXTRACTION Bilateral   . FEMORAL HERNIA REPAIR    . left knee replacement     2006  . right hip placement     2007  . TONSILLECTOMY      Current Medications: Current Meds  Medication Sig  . amLODipine (NORVASC) 5 MG tablet TAKE 1 TABLET(5 MG) BY MOUTH DAILY  . atorvastatin (LIPITOR) 20 MG tablet Take 20 mg by mouth daily.  . finasteride (PROSCAR) 5 MG tablet Take 5 mg by mouth daily.  Marland Kitchen lisinopril-hydrochlorothiazide (PRINZIDE,ZESTORETIC) 10-12.5 MG tablet TAKE 1 TABLET BY MOUTH EVERY DAY  . metFORMIN (GLUCOPHAGE) 1000 MG tablet Take 1,000 mg by mouth daily with breakfast.   . metoprolol succinate (TOPROL-XL) 25 MG 24 hr tablet Take 25 mg by mouth daily.    Allergies:   Actos [pioglitazone]   Social History   Social History  . Marital status: Married    Spouse name: N/A  .  Number of children: N/A  . Years of education: N/A   Social History Main Topics  . Smoking status: Never Smoker  . Smokeless tobacco: Never Used  . Alcohol use No  . Drug use: No  . Sexual activity: Not Asked   Other Topics Concern  . None   Social History Narrative  . None     Family History:  The patient's family history includes Diabetes in his mother; Heart attack in his mother; Heart disease in his mother.   ROS:   Please see the history of present illness.    ROS All other systems reviewed and are negative.  No flowsheet data found.     PHYSICAL EXAM:   VS:  BP 132/62   Pulse 71   Ht  (1.854 m)   Wt 216 lb 1.9 oz (98 kg)   SpO2 99%   BMI 28.51 kg/m    GEN: Well nourished, well developed, in no acute distress  HEENT: normal  Neck: no JVD, carotid bruits, or masses Cardiac: RRR; no murmurs, rubs, or gallops,no edema.  Intact distal pulses bilaterally.  Respiratory:  clear to auscultation bilaterally, normal work of breathing GI: soft, nontender, nondistended, +  BS MS: no deformity or atrophy  Skin: warm and dry, no rash Neuro:  Alert and Oriented x 3, Strength and sensation are intact Psych: euthymic mood, full affect  Wt Readings from Last 3 Encounters:  09/20/16 216 lb 1.9 oz (98 kg)  08/03/16 224 lb (101.6 kg)  06/23/15 222 lb 6.4 oz (100.9 kg)      Studies/Labs Reviewed:   EKG:  EKG is not ordered today.    Recent Labs: No results found for requested labs within last 8760 hours.   Lipid Panel No results found for: CHOL, TRIG, HDL, CHOLHDL, VLDL, LDLCALC, LDLDIRECT  Additional studies/ records that were reviewed today include:  CPAP dowlnoad    ASSESSMENT:    1. Obstructive sleep apnea   2. Essential hypertension   3. Obesity (BMI 30-39.9)      PLAN:  In order of problems listed above:  OSA - the patient is tolerating PAP therapy well without any problems. The PAP download was reviewed today and showed an AHI of 4.2/hr on 10  cm H2O with 61% compliance in using more than 4 hours nightly.  The patient has been using and benefiting from CPAP use and will continue to benefit from therapy. I have encouraged him to try to be more compliant with his mask.  HTN - BP is adequately controlled on exam today. He will continue on amlodipine, ACE I, BB and diuretic.  Obesity - I have encouraged him to get into a routine exercise program and cut back on carbs and portions.     Medication Adjustments/Labs and Tests Ordered: Current medicines are reviewed at length with the patient today.  Concerns regarding medicines are outlined above.  Medication changes, Labs and Tests ordered today are listed in the Patient Instructions below.  There are no Patient Instructions on file for this visit.   Signed, Armanda Magic, MD  09/20/2016 9:17 AM    [facility] [address] [place], [place], [place]  [address] Phone: [phone]; Fax: [phone]

## 2017-06-30 ENCOUNTER — Telehealth: Payer: Self-pay | Admitting: Cardiology

## 2017-06-30 NOTE — Telephone Encounter (Signed)
New Message    Patrick Henson is calling on behalf of spouse. She states that [facility] is advising that he is due for a new CPAP machine. In order to get the new machine they need a rx from the provider. Please call to discuss.

## 2017-07-04 ENCOUNTER — Other Ambulatory Visit: Payer: Self-pay | Admitting: Interventional Cardiology

## 2017-07-06 NOTE — Telephone Encounter (Signed)
Patient is scheduled for an office visit on July 20 2017 to see Dr Mayford Knifeurner and to get a new prescription for a new machine.

## 2017-07-13 ENCOUNTER — Encounter: Payer: Self-pay | Admitting: Cardiology

## 2017-07-20 ENCOUNTER — Encounter: Payer: Self-pay | Admitting: Cardiology

## 2017-07-20 ENCOUNTER — Ambulatory Visit: Payer: Medicare Other | Admitting: Cardiology

## 2017-07-20 ENCOUNTER — Other Ambulatory Visit: Payer: Self-pay | Admitting: Cardiology

## 2017-07-20 VITALS — BP 148/64 | HR 66 | Ht 73.0 in | Wt 222.6 lb

## 2017-07-20 DIAGNOSIS — E669 Obesity, unspecified: Secondary | ICD-10-CM

## 2017-07-20 DIAGNOSIS — I10 Essential (primary) hypertension: Secondary | ICD-10-CM | POA: Diagnosis not present

## 2017-07-20 DIAGNOSIS — G4733 Obstructive sleep apnea (adult) (pediatric): Secondary | ICD-10-CM

## 2017-07-20 MED ORDER — LISINOPRIL-HYDROCHLOROTHIAZIDE 20-12.5 MG PO TABS: 1.0000 | ORAL_TABLET | Freq: Every day | ORAL | 0 refills | Status: DC

## 2017-07-20 NOTE — Patient Instructions (Signed)
Medication Instructions:  Your physician has recommended you make the following change in your medication:  INCREASE: lisinopril-HCTZ 20-12.5 mg once a day   Labwork: Your physician recommends that you return for lab work in: 2 weeks for kidney function test    Testing/Procedures: None Ordered   Follow-Up: Your physician recommends that you schedule a follow-up appointment in: 2 weeks at the hypertension clinic   Your physician recommends that you schedule a follow-up appointment in: 10 weeks with Dr.Turner   Any Other Special Instructions Will Be Listed Below (If Applicable).  CPAP orders have been placed. You will receive a call from the home health agency regarding setting up equipment. If you do not receive a call within the next week give Coralee Northina, CPAP assistant a call at [phone].   Thank you for choosing [facility]    Lyda PeroneRena Anaiza Behrens, RN  [phone]    If you need a refill on your cardiac medications before your next appointment, please call your pharmacy.

## 2017-07-20 NOTE — Progress Notes (Signed)
Cardiology Office Note:    Date:  07/20/2017   ID:  Patrick SiresJack G Vanaman, DOB 11/21/1940, MRN [id number]  PCP:  Catha GosselinLittle, Kevin, MD  Cardiologist:  No primary care provider on file.    Referring MD: Catha GosselinLittle, Kevin, MD   Chief Complaint  Patient presents with  . Sleep Apnea  . Hypertension    History of Present Illness:    Patrick Henson is a 77 y.o. male with a hx of OSA on CPAP, HTN and obesity.  He is doing well with his CPAP device.  He tolerates the nasal mask and feels the pressure is adequate.  Since going on CPAP he feels rested in the am and has less daytime sleepiness. He sleeps on his side and has not had a problems with leaking of his mask recently.   He is using a loaner CPAP for now because his machine's cord is burned out.  He is here to see me to get a new Rx for a CPAP device.  He denies any significant mouth or nasal dryness or nasal congestion.  His wife states that he does not really snore much.    Past Medical History:  Diagnosis Date  . Aortic stenosis    AVA .96 cm2.2011  . Arthritis   . Diabetes mellitus   . Hyperlipidemia   . Hypertension   . Obesity (BMI 30-39.9)   . Obstructive sleep apnea     Past Surgical History:  Procedure Laterality Date  . AORTIC VALVE REPLACEMENT     per patient reports, [facility] and then had rehab  . APPENDECTOMY    . CATARACT EXTRACTION Bilateral   . FEMORAL HERNIA REPAIR    . left knee replacement     2006  . right hip placement     2007  . TONSILLECTOMY      Current Medications: Current Meds  Medication Sig  . amLODipine (NORVASC) 5 MG tablet Take 1 tablet (5 mg total) by mouth daily. Please keep upcoming appt for future refills. Thank you  . atorvastatin (LIPITOR) 20 MG tablet Take 20 mg by mouth daily.  . finasteride (PROSCAR) 5 MG tablet Take 5 mg by mouth daily.  Marland Kitchen. lisinopril-hydrochlorothiazide (PRINZIDE,ZESTORETIC) 10-12.5 MG tablet TAKE 1 TABLET BY MOUTH EVERY DAY  . metFORMIN (GLUCOPHAGE) 1000 MG  tablet Take 1,000 mg by mouth daily with breakfast.   . metoprolol succinate (TOPROL-XL) 25 MG 24 hr tablet Take 25 mg by mouth daily.     Allergies:   Actos [pioglitazone]   Social History   Socioeconomic History  . Marital status: Married    Spouse name: None  . Number of children: None  . Years of education: None  . Highest education level: None  Social Needs  . Financial resource strain: None  . Food insecurity - worry: None  . Food insecurity - inability: None  . Transportation needs - medical: None  . Transportation needs - non-medical: None  Occupational History  . None  Tobacco Use  . Smoking status: Never Smoker  . Smokeless tobacco: Never Used  Substance and Sexual Activity  . Alcohol use: No  . Drug use: No  . Sexual activity: None  Other Topics Concern  . None  Social History Narrative  . None     Family History: The patient's family history includes Diabetes in his mother; Heart attack in his mother; Heart disease in his mother.  ROS:   Please see the history of present illness.  ROS  All other systems reviewed and negative.   EKGs/Labs/Other Studies Reviewed:    The following studies were reviewed today: CPAP download  EKG:  EKG is not ordered today.  Recent Labs: No results found for requested labs within last 8760 hours.   Recent Lipid Panel No results found for: CHOL, TRIG, HDL, CHOLHDL, VLDL, LDLCALC, LDLDIRECT  Physical Exam:    VS:  BP (!) 148/64   Pulse 66   Ht 6\' 1"  (1.854 m)   Wt 222 lb 9.6 oz (101 kg)   SpO2 98%   BMI 29.37 kg/m     Wt Readings from Last 3 Encounters:  07/20/17 222 lb 9.6 oz (101 kg)  09/20/16 216 lb 1.9 oz (98 kg)  08/03/16 224 lb (101.6 kg)     GEN:  Well nourished, well developed in no acute distress HEENT: Normal NECK: No JVD; No carotid bruits LYMPHATICS: No lymphadenopathy CARDIAC: RRR, no murmurs, rubs, gallops RESPIRATORY:  Clear to auscultation without rales, wheezing or rhonchi  ABDOMEN:  Soft, non-tender, non-distended MUSCULOSKELETAL:  No edema; No deformity  SKIN: Warm and dry NEUROLOGIC:  Alert and oriented x 3 PSYCHIATRIC:  Normal affect   ASSESSMENT:    1. Obstructive sleep apnea   2. Essential hypertension   3. Obesity (BMI 30-39.9)    PLAN:    In order of problems listed above:  1.  OSA - the patient is tolerating PAP therapy well without any problems. The PAP download was reviewed today and showed an AHI of 3.9/hr on 10 cm H2O with 73% compliance in using more than 4 hours nightly.  The patient has been using and benefiting from PAP use and will continue to benefit from therapy.   2.  HTN - BP is borderline controlled on exam today. At home his BP runs 158/45mmHg.  He will continue on Toprol XL 25mg  daily and amlodipine 5mg  daily. I will increase Lisinopril HCT to 20/12.5mg  daily and have him followup in HTN clinic in [redacted] weeks along with a BMET.   Creatinine was stable at 1.12 on 06/21/2017.  3.  Obesity - I have encouraged him to get into a routine exercise program and cut back on carbs and portions. His exercise is limited by chronic hip pain.    Medication Adjustments/Labs and Tests Ordered: Current medicines are reviewed at length with the patient today.  Concerns regarding medicines are outlined above.  No orders of the defined types were placed in this encounter.  No orders of the defined types were placed in this encounter.   Signed, Armanda Magic, MD  07/20/2017 12:15 PM    [facility]

## 2017-07-25 ENCOUNTER — Telehealth: Payer: Self-pay | Admitting: *Deleted

## 2017-07-25 NOTE — Telephone Encounter (Signed)
-----   Message from Phineas Semenonnisha Robertson, RN sent at 07/20/2017 12:59 PM EST ----- Regarding: dme order dme order placed  Thanks  Rena

## 2017-07-25 NOTE — Telephone Encounter (Addendum)
Order sent o [facility] via community message for Resmed cpap with heated humidity at 10cm H2O

## 2017-08-02 NOTE — Progress Notes (Signed)
Cardiology Office Note    Date:  08/03/2017   ID:  Patrick SiresJack G Henson, DOB 06/30/1940, MRN [id number]  PCP:  Catha GosselinLittle, Kevin, MD  Cardiologist: Lesleigh NoeHenry W Voncille Simm III, MD   No chief complaint on file.   History of Present Illness:  Patrick Henson is a 77 y.o. male with history of [facility] valve [facility], hyperlipidemia, essential hypertension, conduction system disease with sinus bradycardia, fascicular block, and first-degree AV block   He denies angina.  He has had some pain that feels as though it extends from the upper part of the sternum out to his right shoulder.  It only occurs when he is at rest.  It lasts less than 5 minutes.  There are no other associated complaints.  He denies orthopnea PND.  He has not had edema.  No episodes of syncope, orthopnea, dyspnea on exertion, or chest pain of anginal quality.   Past Medical History:  Diagnosis Date  . Aortic stenosis    AVA .96 cm2.2011  . Arthritis   . Diabetes mellitus   . Hyperlipidemia   . Hypertension   . Obesity (BMI 30-39.9)   . Obstructive sleep apnea     Past Surgical History:  Procedure Laterality Date  . AORTIC VALVE REPLACEMENT     per patient reports, [facility] in [facility] and then had rehab  . APPENDECTOMY    . CATARACT EXTRACTION Bilateral   . FEMORAL HERNIA REPAIR    . left knee replacement     2006  . right hip placement     2007  . TONSILLECTOMY      Current Medications: Outpatient Medications Prior to Visit  Medication Sig Dispense Refill  . amLODipine (NORVASC) 5 MG tablet Take 1 tablet (5 mg total) by mouth daily. Please keep upcoming appt for future refills. Thank you 90 tablet 0  . atorvastatin (LIPITOR) 20 MG tablet Take 20 mg by mouth daily.    . finasteride (PROSCAR) 5 MG tablet Take 5 mg by mouth daily.    Marland Kitchen. lisinopril-hydrochlorothiazide (PRINZIDE,ZESTORETIC) 20-12.5 MG tablet TAKE 1 TABLET BY MOUTH DAILY 90 tablet 3  . metFORMIN (GLUCOPHAGE) 1000 MG tablet  Take 1,000 mg by mouth daily with breakfast.     . metoprolol succinate (TOPROL-XL) 25 MG 24 hr tablet Take 25 mg by mouth daily.     No facility-administered medications prior to visit.      Allergies:   Actos [pioglitazone]   Social History   Socioeconomic History  . Marital status: Married    Spouse name: None  . Number of children: None  . Years of education: None  . Highest education level: None  Social Needs  . Financial resource strain: None  . Food insecurity - worry: None  . Food insecurity - inability: None  . Transportation needs - medical: None  . Transportation needs - non-medical: None  Occupational History  . None  Tobacco Use  . Smoking status: Never Smoker  . Smokeless tobacco: Never Used  Substance and Sexual Activity  . Alcohol use: No  . Drug use: No  . Sexual activity: None  Other Topics Concern  . None  Social History Narrative  . None     Family History:  The patient's family history includes Diabetes in his mother; Heart attack in his mother; Heart disease in his mother.   ROS:   Please see the history of present illness.    Vertigo is limiting physical  activity.  If he turns too quickly he loses balance.  If he is having pain in his left knee which was previously replaced and also the right hip which was previously replaced. All other systems reviewed and are negative.   PHYSICAL EXAM:   VS:  BP 128/60   Pulse 63   Ht 6\' 1"  (1.854 m)   Wt 219 lb (99.3 kg)   BMI 28.89 kg/m     GEN: Well nourished, well developed, in no acute distress  HEENT: normal  Neck: no JVD, carotid bruits, or masses Cardiac: There is a 2/6 to 3/6 crescendo decrescendo right upper sternal border systolic murmur from the prosthetic valve.  No diastolic murmurs heard.  RRR; no rubs, or gallops,no edema . Respiratory:  clear to auscultation bilaterally, normal work of breathing GI: soft, nontender, nondistended, + BS MS: no deformity or atrophy  Skin: warm and dry,  no rash Neuro:  Alert and Oriented x 3, Strength and sensation are intact Psych: euthymic mood, full affect  Wt Readings from Last 3 Encounters:  08/03/17 219 lb (99.3 kg)  07/20/17 222 lb 9.6 oz (101 kg)  09/20/16 216 lb 1.9 oz (98 kg)      Studies/Labs Reviewed:   EKG:  EKG left anterior hemiblock, right bundle branch block, first-degree AV block.When compared to the prior tracing from March 2018, the heart rate is faster.  Recent Labs: No results found for requested labs within last 8760 hours.   Lipid Panel No results found for: CHOL, TRIG, HDL, CHOLHDL, VLDL, LDLCALC, LDLDIRECT  Additional studies/ records that were reviewed today include:  Echocardiogram March 2018: Study Conclusions   - Left ventricle: The cavity size was normal. There was mild   concentric hypertrophy. Systolic function was normal. The   estimated ejection fraction was in the range of 60% to 65%. Wall   motion was normal; there were no regional wall motion   abnormalities. Features are consistent with a pseudonormal left   ventricular filling pattern, with concomitant abnormal relaxation   and increased filling pressure (grade 2 diastolic dysfunction).   Doppler parameters are consistent with high ventricular filling   pressure. - Aortic valve: A bioprosthesis was present and functioning   normally. The mean AVG is mildly elevated at There was   mild regurgitation. Mean gradient (S): 26 mm Hg. - Aorta: Ascending aortic diameter: 39 mm (S). - Ascending aorta: The ascending aorta was mildly dilated. - Mitral valve: Moderately calcified annulus. There was mild   regurgitation. - Left atrium: The atrium was moderately dilated. - Atrial septum: There was increased thickness of the septum,   consistent with lipomatous hypertrophy. - Tricuspid valve: There was mild regurgitation.   Impressions:   - No significant change in mean AV gradient from prior echo (24mm   in 2015 and  now)    ASSESSMENT:    1. History of aortic valve replacement with bioprosthetic valve   2. Right bundle branch block   3. Paroxysmal atrial fibrillation (HCC)   4. Obstructive sleep apnea   5. Other hyperlipidemia   6. Frailty      PLAN:  In order of problems listed above:  1. Valve auscultation is similar to one year ago.  He denies orthopnea, PND, and syncope.  He has some atypical chest pain that is not felt to represent angina. 2. 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  He will be at increased risk for pacemaker therapy.  No symptoms warrant further investigation at  this time. 3. Past history with no recent recurrences. 4. I assume that he is therapy with sleep device. 5. LDL target less than 70. 6. Frailty with some vertigo making fall risk higher.  I encouraged physical activity, that he follow-up with ENT concerning the vertigo.  And that he consider Silver sneakers program at the [facility].  Clinical follow-up in 1 year.  No change in current therapy.  Medication Adjustments/Labs and Tests Ordered: Current medicines are reviewed at length with the patient today.  Concerns regarding medicines are outlined above.  Medication changes, Labs and Tests ordered today are listed in the Patient Instructions below. Patient Instructions  Medication Instructions:  Your physician recommends that you continue on your current medications as directed. Please refer to the Current Medication list given to you today.  Labwork: None  Testing/Procedures: None  Follow-Up: Your physician wants you to follow-up in: 1 year with Dr. Katrinka Blazing. You will receive a reminder letter in the mail two months in advance. If you don't receive a letter, please call our office to schedule the follow-up appointment.   Any Other Special Instructions Will Be Listed Below (If Applicable).     If you need a refill on your cardiac medications before your next appointment, please call your pharmacy.       Signed, Lesleigh Noe, MD  08/03/2017 2:48 PM    [facility] [address] [place], [place], [place]  [address] Phone: [phone]; Fax: [phone]

## 2017-08-03 ENCOUNTER — Ambulatory Visit: Payer: Medicare Other | Admitting: Interventional Cardiology

## 2017-08-03 ENCOUNTER — Encounter: Payer: Self-pay | Admitting: Interventional Cardiology

## 2017-08-03 VITALS — BP 128/60 | HR 63 | Ht 73.0 in | Wt 219.0 lb

## 2017-08-03 DIAGNOSIS — I480 Paroxysmal atrial fibrillation: Secondary | ICD-10-CM

## 2017-08-03 DIAGNOSIS — R54 Age-related physical debility: Secondary | ICD-10-CM

## 2017-08-03 DIAGNOSIS — I4510 Unspecified right bundle-branch block: Secondary | ICD-10-CM | POA: Diagnosis not present

## 2017-08-03 DIAGNOSIS — G4733 Obstructive sleep apnea (adult) (pediatric): Secondary | ICD-10-CM

## 2017-08-03 DIAGNOSIS — E7849 Other hyperlipidemia: Secondary | ICD-10-CM | POA: Diagnosis not present

## 2017-08-03 DIAGNOSIS — Z953 Presence of xenogenic heart valve: Secondary | ICD-10-CM

## 2017-08-03 NOTE — Patient Instructions (Signed)

## 2017-08-04 ENCOUNTER — Telehealth: Payer: Self-pay | Admitting: *Deleted
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
## 2017-08-06 NOTE — Progress Notes (Signed)
Patient ID: Patrick Henson                 DOB: [birth year]                      MRN: [id number]     HPI: Patrick Henson is a 77 y.o. male patient of Dr. Mayford Knife who presents today for hypertension evaluation. PMH of HTN, OSA on CPAP, obesity, aortic valve replacement 2013, DM, and HLD.  At last office visit with Dr. Mayford Knife on 2/21, patient's blood pressure measured at 140/64 mmHg on Toprol XL 25mg  daily, amlodipine 5mg  daily, and Lisinopril HCTZ 10/12.5mg  daily. Patient reported home blood pressure readings to be in the 150s/70s mmHg. Dr. Mayford Knife increased his Lisinopril HCTZ to 20/12.5mg  and ordered a BMET to be drawn at today's pharmacy clinic visit. Notably, patient recently has been experiencing vertigo per Dr. Michaelle Copas note from 3/7.   In clinic today, patient endorses tolerance to Lisinopril HCTZ medication dose change. We dicussed his current vertigo and he reports feeling symptomatic upon waking up in the morning and sporadically throughout the day. He did not feel vertigo walking back to the clinic room today. He states having had inner ear disequilibrium in the past requiring some treatment and suspects that this might be the same issue. His PCP sent a referral to neurology which is currently pending. Patient otherwise denies any other symptoms of headache, blurry vision or syncope. He brings his blood pressure log today which shows readings of 140-150/70s mmHg. He reports measuring his blood pressure every morning before taking his medications.   Current HTN meds: Toprol XL 25mg  daily, amlodipine 5mg  daily, Lisinopril HCTZ  20/12.5mg  daily   Previously tried: Lisinopril HCTZ 10/12.5mg  daily, ramipril 10 mg daily   BP goal: <130/80 mmHg  Family History: mother: DM, MI, and CVD  Social History: Never smoker of cigarettes or user of tobacco. Denies alcohol use.   Diet: breakfast - eggs, [place] sausage, oranges, blueberries. Lunch - leftovers from supper: chicken, veggies, a starch. No coffee,  occasional tea, Cheerwine thoroughout the day.   Exercise: exercise limited by chronic hip pain. Advised by Dr. Katrinka Blazing to get vertigo under control before starting an exercise regimen.   Home BP readings: 140-150s  Wt Readings from Last 3 Encounters:  08/03/17 219 lb (99.3 kg)  07/20/17 222 lb 9.6 oz (101 kg)  09/20/16 216 lb 1.9 oz (98 kg)   BP Readings from Last 3 Encounters:  08/07/17 (!) 156/62  08/03/17 128/60  07/20/17 (!) 148/64   Pulse Readings from Last 3 Encounters:  08/07/17 80  08/03/17 63  07/20/17 66    Renal function: Estimated Creatinine Clearance: 67.5 mL/min (by C-G formula based on SCr of 1.1 mg/dL).  Past Medical History:  Diagnosis Date  . Aortic stenosis    AVA .96 cm2.2011  . Arthritis   . Diabetes mellitus   . Hyperlipidemia   . Hypertension   . Obesity (BMI 30-39.9)   . Obstructive sleep apnea     Current Outpatient Medications on File Prior to Visit  Medication Sig Dispense Refill  . atorvastatin (LIPITOR) 20 MG tablet Take 20 mg by mouth daily.    . finasteride (PROSCAR) 5 MG tablet Take 5 mg by mouth daily.    Marland Kitchen lisinopril-hydrochlorothiazide (PRINZIDE,ZESTORETIC) 20-12.5 MG tablet TAKE 1 TABLET BY MOUTH DAILY 90 tablet 3  . metFORMIN (GLUCOPHAGE) 1000 MG tablet Take 1,000 mg by mouth daily with breakfast.     .  metoprolol succinate (TOPROL-XL) 25 MG 24 hr tablet Take 25 mg by mouth daily.     No current facility-administered medications on file prior to visit.     Allergies  Allergen Reactions  . Actos [Pioglitazone] Swelling    Blood pressure (!) 156/62, pulse 80, SpO2 98 %.  Assessment/Plan:  Hypertension: Blood pressure is above the goal of <130/80 mmHg. Given the patient's current vertigo status, will conservatively increase his antihypertensive medication. Increase amlodipine to 7.5 mg po daily. Recommended to patient to measure blood pressure in the morning 2 hours after taking medications so we can assess medication efficacy.  Follow-up in pharmacy clinic in 3 weeks for further hypertension management.    Adline PotterSabrina Sophiya Morello, PharmD Pharmacy Resident Pager: [phone]

## 2017-08-07 ENCOUNTER — Other Ambulatory Visit: Payer: Medicare Other | Admitting: *Deleted

## 2017-08-07 ENCOUNTER — Ambulatory Visit (INDEPENDENT_AMBULATORY_CARE_PROVIDER_SITE_OTHER): Payer: Medicare Other

## 2017-08-07 VITALS — BP 156/62 | HR 80

## 2017-08-07 DIAGNOSIS — I10 Essential (primary) hypertension: Secondary | ICD-10-CM

## 2017-08-07 LAB — BASIC METABOLIC PANEL WITH GFR
BUN/Creatinine Ratio: 17 (ref 10–24)
BUN: 19 mg/dL (ref 8–27)
CO2: 26 mmol/L (ref 20–29)
Calcium: 9.9 mg/dL (ref 8.6–10.2)
Chloride: 103 mmol/L (ref 96–106)
Creatinine, Ser: 1.1 mg/dL (ref 0.76–1.27)
GFR calc Af Amer: 73 mL/min/1.73
GFR calc non Af Amer: 64 mL/min/1.73
Glucose: 104 mg/dL — ABNORMAL HIGH (ref 65–99)
Potassium: 4.6 mmol/L (ref 3.5–5.2)
Sodium: 142 mmol/L (ref 134–144)

## 2017-08-07 MED ORDER — AMLODIPINE BESYLATE 5 MG PO TABS: 7.5000 mg | ORAL_TABLET | Freq: Every day | ORAL | 3 refills | Status: DC

## 2017-08-07 NOTE — Patient Instructions (Signed)
We will be increasing your amlodipine dose to help lower your blood pressure. Start taking amlodipine 7.5 mg every day  Return to pharmacy clinic in 3 weeks (08/28/2017 at 4:00pm)  for further follow-up.

## 2017-08-28 ENCOUNTER — Ambulatory Visit (INDEPENDENT_AMBULATORY_CARE_PROVIDER_SITE_OTHER): Payer: Medicare Other

## 2017-08-28 VITALS — BP 126/56 | HR 60

## 2017-08-28 DIAGNOSIS — I10 Essential (primary) hypertension: Secondary | ICD-10-CM

## 2017-08-28 NOTE — Progress Notes (Signed)
Patient ID: Patrick Henson                 DOB: [birth year]                      MRN: [id number]     HPI: Patrick Henson is a 77 y.o. male patient of Dr. Mayford Knife who presents today for hypertension follow-up. PMH of HTN, OSA on CPAP, obesity, aortic valve replacement 2013, DM, and HLD. At last pharmacy clinic visit, patient was experiencing vertigo from suspected inner ear disequilibrium and awaiting referral to neurology. The blood pressure log he brought to clinic showed readings of 140-150/70s mmHg and patient reported measuring his blood pressure every morning before taking his medications. Given the patient's current vertigo status, we conservatively increased his amlodipine to 7.5 mg daily. Patient was also advised to measure blood pressure in the morning 2 hours after taking medications so we can assess medication efficacy.  In clinic today, patient reports some intermittent vertigo for which he will be following-up with neurology in May. He denies all other adverse effects of headache, blurred vision, palpitations, and syncope. He reports tolerating the amlodipine dose titration.   Current HTN meds: Toprol XL  daily, amlodipine  daily, Lisinopril HCTZ  20/12.5mg  daily    Previously tried: Lisinopril HCTZ 10/12.5mg  daily, ramipril 10 mg daily, amlodipine  daily  BP goal: <130/80 mmHg   Family History: mother: DM, MI, and CVD  Social History: Never smoker of cigarettes or user of tobacco. Denies alcohol use.   Diet: breakfast - eggs, [place] sausage, oranges, blueberries. Lunch - leftovers from supper: chicken, veggies, a starch. No coffee, occasional tea, Cheerwine thoroughout the day.   Exercise: exercise limited by chronic hip pain. Advised by Dr. Katrinka Blazing to get vertigo under control before starting an exercise regimen.   Home BP readings: 110-130/50-70s mmHg  Wt Readings from Last 3 Encounters:  08/03/17 219 lb (99.3 kg)  07/20/17 222 lb 9.6 oz (101 kg)  09/20/16 216 lb 1.9 oz  (98 kg)   BP Readings from Last 3 Encounters:  08/28/17 (!) 126/56  08/07/17 (!) 156/62  08/03/17 128/60   Pulse Readings from Last 3 Encounters:  08/28/17 60  08/07/17 80  08/03/17 63    Renal function: CrCl cannot be calculated (Patient's most recent lab result is older than the maximum 21 days allowed.).  Past Medical History:  Diagnosis Date  . Aortic stenosis    AVA .96 cm2.2011  . Arthritis   . Diabetes mellitus   . Hyperlipidemia   . Hypertension   . Obesity (BMI 30-39.9)   . Obstructive sleep apnea     Current Outpatient Medications on File Prior to Visit  Medication Sig Dispense Refill  . amLODipine (NORVASC) 5 MG tablet Take 1.5 tablets (7.5 mg total) by mouth daily. 135 tablet 3  . atorvastatin (LIPITOR) 20 MG tablet Take 20 mg by mouth daily.    . finasteride (PROSCAR) 5 MG tablet Take 5 mg by mouth daily.    Marland Kitchen lisinopril-hydrochlorothiazide (PRINZIDE,ZESTORETIC) 20-12.5 MG tablet TAKE 1 TABLET BY MOUTH DAILY 90 tablet 3  . metFORMIN (GLUCOPHAGE) 1000 MG tablet Take 1,000 mg by mouth daily with breakfast.     . metoprolol succinate (TOPROL-XL) 25 MG 24 hr tablet Take 25 mg by mouth daily.     No current facility-administered medications on file prior to visit.     Allergies  Allergen Reactions  . Actos [Pioglitazone] Swelling  Blood pressure (!) 126/56, pulse 60, SpO2 98 %.   Assessment/Plan:  Hypertension: Patient is within the goal of <130/80 mmHg and is tolerating his blood pressure medication regimen. Will make no changes to hypertension medications. Recommend to continue home blood pressure monitoring ensuring that he measures readings 2 hours after taking antihypertensive medications in the morning. Follow-up at the pharmacy clinic as needed.   Adline Potter, PharmD Pharmacy Resident Pager: [phone]

## 2017-08-28 NOTE — Patient Instructions (Signed)
No medication changes today.   Continue monitoring your blood pressure at home. Continue to check blood pressure 2 hours after taking blood pressure medication in the morning.   Follow-up with pharmacy clinic as needed.

## 2017-09-05 NOTE — Telephone Encounter (Addendum)
Patient has a 10 week follow up appointment scheduled for June 4 at [facility]9am 2019. Patient understands she needs to keep this appointment for insurance compliance. Patient was grateful for the call and thanked me.

## 2017-09-28 ENCOUNTER — Ambulatory Visit: Payer: Medicare Other | Admitting: Cardiology

## 2017-10-04 ENCOUNTER — Ambulatory Visit: Payer: Medicare Other | Admitting: Diagnostic Neuroimaging

## 2017-10-04 ENCOUNTER — Telehealth: Payer: Self-pay | Admitting: Diagnostic Neuroimaging

## 2017-10-04 ENCOUNTER — Encounter: Payer: Self-pay | Admitting: Diagnostic Neuroimaging

## 2017-10-04 VITALS — BP 115/56 | HR 61 | Ht 73.0 in | Wt 217.0 lb

## 2017-10-04 DIAGNOSIS — H8113 Benign paroxysmal vertigo, bilateral: Secondary | ICD-10-CM

## 2017-10-04 DIAGNOSIS — R413 Other amnesia: Secondary | ICD-10-CM

## 2017-10-04 NOTE — Progress Notes (Signed)
[facility]  PATIENT: Patrick Henson DOB: Jul 31, 1940  REFERRING CLINICIAN: Wyline Beady, MD HISTORY FROM: patient and wife  REASON FOR VISIT: new consult     HISTORICAL  CHIEF COMPLAINT:  Chief Complaint  Patient presents with  . New Patient (Initial Visit)    vertigo spells; last couple of weeks he hasn't had them but when he called it was bad. He noticed the vertigo when he would bend over or do something fast with his head. He woud notice it when he got up in the morning.    Marland Kitchen Referral    Dr. Clarene Duke  . Room 7    He is here with his wife    HISTORY OF PRESENT ILLNESS:   77 year old male here for evaluation of vertigo.  Around 2009 patient had intermittent episodes of positional vertigo.  This was treated with repositioning exercises and symptoms resolved.  He would have spinning sensation, typically triggered by turning his head or body.  Sometimes nausea. Similar symptoms returned about 1 to 2 months ago.  No symptoms today.  At the end of this visit, patient's wife mentioned her concern about patient's memory loss.  Patient denies any significant memory problems.  According to wife he has had gradual onset progressive short-term memory loss, confusion, difficulty with driving directions, difficulty with day-to-day activities.  He is able to maintain his own personal hygiene and affairs.  He is having more problems with other more complex tasks.    REVIEW OF SYSTEMS: Full 14 system review of systems performed and negative with exception of: Memory loss easy bruising joint pain skin sensitivity frequent urination spinning sensation fatigue.  ALLERGIES: Allergies  Allergen Reactions  . Actos [Pioglitazone] Swelling  . Tradjenta [Linagliptin]     Pt unsure of his reaction    HOME MEDICATIONS: Outpatient Medications Prior to Visit  Medication Sig Dispense Refill  . amLODipine (NORVASC) 5 MG tablet Take 1.5 tablets (7.5 mg total) by mouth daily. 135 tablet 3   . atorvastatin (LIPITOR) 20 MG tablet Take 20 mg by mouth daily.    . finasteride (PROSCAR) 5 MG tablet Take 5 mg by mouth daily.    Marland Kitchen lisinopril-hydrochlorothiazide (PRINZIDE,ZESTORETIC) 20-12.5 MG tablet TAKE 1 TABLET BY MOUTH DAILY 90 tablet 3  . metFORMIN (GLUCOPHAGE) 1000 MG tablet Take 1,000 mg by mouth daily with breakfast.     . metoprolol succinate (TOPROL-XL) 25 MG 24 hr tablet Take 25 mg by mouth daily.     No facility-administered medications prior to visit.     PAST MEDICAL HISTORY: Past Medical History:  Diagnosis Date  . Aortic stenosis    AVA .96 cm2.2011  . Arthritis   . Diabetes mellitus   . Hyperlipidemia   . Hypertension   . Obesity (BMI 30-39.9)   . Obstructive sleep apnea     PAST SURGICAL HISTORY: Past Surgical History:  Procedure Laterality Date  . AORTIC VALVE REPLACEMENT     per patient reports, performed at [facility] in 2013 and then had rehab  . APPENDECTOMY    . CATARACT EXTRACTION Bilateral   . FEMORAL HERNIA REPAIR    . left knee replacement     2006  . right hip placement     2007  . TONSILLECTOMY      FAMILY HISTORY: Family History  Problem Relation Age of Onset  . Heart attack Mother   . Heart disease Mother   . Diabetes Mother   . Cancer Mother   . Heart  attack Father   . Skin cancer Sister     SOCIAL HISTORY:  Social History   Socioeconomic History  . Marital status: Married    Spouse name: Not on file  . Number of children: 2  . Years of education: Not on file  . Highest education level: Master's degree (e.g., MA, MS, MEng, MEd, MSW, MBA)  Occupational History  . Not on file  Social Needs  . Financial resource strain: Not on file  . Food insecurity:    Worry: Not on file    Inability: Not on file  . Transportation needs:    Medical: Not on file    Non-medical: Not on file  Tobacco Use  . Smoking status: Never Smoker  . Smokeless tobacco: Never Used  Substance and Sexual Activity  . Alcohol use: No  . Drug use:  No  . Sexual activity: Not on file  Lifestyle  . Physical activity:    Days per week: Not on file    Minutes per session: Not on file  . Stress: Not on file  Relationships  . Social connections:    Talks on phone: Not on file    Gets together: Not on file    Attends religious service: Not on file    Active member of club or organization: Not on file    Attends meetings of clubs or organizations: Not on file    Relationship status: Not on file  . Intimate partner violence:    Fear of current or ex partner: Not on file    Emotionally abused: Not on file    Physically abused: Not on file    Forced sexual activity: Not on file  Other Topics Concern  . Not on file  Social History Narrative   Lives at home with his wife   Right handed   Caffeine: 1-2 cokes daily     PHYSICAL EXAM  GENERAL EXAM/CONSTITUTIONAL: Vitals:  Vitals:   10/04/17 1003  BP: (!) 115/56  Pulse: 61  Weight: 217 lb (98.4 kg)  Height:  (1.854 m)     Body mass index is 28.63 kg/m.  No exam data present  Patient is in no distress; well developed, nourished and groomed; neck is supple  CARDIOVASCULAR:  Examination of carotid arteries is normal; no carotid bruits  Regular rate and rhythm, no murmurs  Examination of peripheral vascular system by observation and palpation is normal  EYES:  Ophthalmoscopic exam of optic discs and posterior segments is normal; no papilledema or hemorrhages  MUSCULOSKELETAL:  Gait, strength, tone, movements noted in Neurologic exam below  NEUROLOGIC: MENTAL STATUS:  MMSE - Mini Mental State Exam 10/04/2017  Orientation to time 5  Orientation to Place 4  Registration 3  Attention/ Calculation 5  Recall 0  Language- name 2 objects 2  Language- repeat 1  Language- follow 3 step command 3  Language- read & follow direction 1  Write a sentence 1  Copy design 1  Total score 26    awake, alert, oriented to person, place and time  DECR RECALL  normal  attention and concentration  language fluent, comprehension intact, naming intact,   fund of knowledge appropriate  CRANIAL NERVE:   2nd - no papilledema on fundoscopic exam  2nd, 3rd, 4th, 6th - pupils equal and reactive to light, visual fields full to confrontation, extraocular muscles intact, no nystagmus  5th - facial sensation symmetric  7th - facial strength symmetric  8th - hearing intact  9th - palate elevates symmetrically, uvula midline  11th - shoulder shrug symmetric  12th - tongue protrusion midline  MOTOR:   normal bulk and tone, full strength in the BUE, BLE  SENSORY:   normal and symmetric to light touch  DECR VIB IN FEET  COORDINATION:   finger-nose-finger, fine finger movements normal  REFLEXES:   deep tendon reflexes TRACE and symmetric  GAIT/STATION:   narrow based gait    DIAGNOSTIC DATA (LABS, IMAGING, TESTING) - I reviewed patient records, labs, notes, testing and imaging myself where available.  No results found for: WBC, HGB, HCT, MCV, PLT    Component Value Date/Time   NA 142 08/07/2017 1119   K 4.6 08/07/2017 1119   CL 103 08/07/2017 1119   CO2 26 08/07/2017 1119   GLUCOSE 104 (H) 08/07/2017 1119   GLUCOSE 172 (H) 06/23/2015 1114   BUN 19 08/07/2017 1119   CREATININE 1.10 08/07/2017 1119   CREATININE 0.93 06/23/2015 1114   CALCIUM 9.9 08/07/2017 1119   GFRNONAA 64 08/07/2017 1119   GFRAA 73 08/07/2017 1119   No results found for: CHOL, HDL, LDLCALC, LDLDIRECT, TRIG, CHOLHDL No results found for: ZOXW9U No results found for: VITAMINB12 No results found for: TSH      ASSESSMENT AND PLAN  77 y.o. year old male here with intermittent positional vertigo, stable and resolved.  Also with gradual short-term memory loss and confusion, with MMSE of 26, and slight difficulty with instrumental activities of daily living.  Could represent myocardial impairment versus prodromal dementia.  We will proceed with further  work-up.  MMSE 26/30  [place] IADL 5/8  Myrtis Ser ADL 6/6   Dx:   1. Benign paroxysmal positional vertigo due to bilateral vestibular disorder   2. Memory loss      PLAN:  POSITIONAL VERTIGO (likely BPV) - resolved; monitor for now  MEMORY LOSS (MCI vs mild dementia) - check MRI brain - check B12, TSH - increase safety, supervision; caregier resources provided - caution with driving and home safety  Orders Placed This Encounter  Procedures  . MR BRAIN WO CONTRAST  . Vitamin B12  . TSH   Return in about 6 months (around 04/06/2018).    Suanne Marker, MD 10/04/2017, 10:14 AM Certified in Neurology, Neurophysiology and Neuroimaging  [facility] [address] [place], [place] [address] [phone]

## 2017-10-04 NOTE — Patient Instructions (Addendum)
-   check MRI brain and labs 

## 2017-10-04 NOTE — Telephone Encounter (Signed)
[employer] medicare order sent to [facility]. No auth they will reach out to the pt to schedule.  °

## 2017-10-05 ENCOUNTER — Telehealth: Payer: Self-pay | Admitting: Neurology

## 2017-10-05 LAB — VITAMIN B12: VITAMIN B 12: 905 pg/mL (ref 232–1245)

## 2017-10-05 LAB — TSH: TSH: 2.51 u[IU]/mL (ref 0.450–4.500)

## 2017-10-05 NOTE — Telephone Encounter (Signed)
Called the pt to review labs. LVM for pt to call back.   When pt returns call please inform the patient that his lab work came back normal.

## 2017-10-05 NOTE — Telephone Encounter (Signed)
-----   Message from Suanne Marker, MD sent at 10/05/2017  1:11 PM EDT ----- Normal labs. Please call patient. -VRP

## 2017-10-13 ENCOUNTER — Telehealth: Payer: Self-pay | Admitting: *Deleted

## 2017-10-13 NOTE — Telephone Encounter (Signed)
Pt is aware and agreeable to normal results. Patient is aware and agreeable to AHI being within range at 4.4. Patient is aware and agreeable to being in compliance with machine usage Patient is aware and agreeable to no change in current pressures

## 2017-10-13 NOTE — Telephone Encounter (Signed)
-----   Message from Quintella Reichert, MD sent at 10/10/2017  8:46 AM EDT ----- Good AHI and compliance.  Continue current PAP settings.

## 2017-10-31 ENCOUNTER — Ambulatory Visit: Payer: Medicare Other | Admitting: Cardiology

## 2017-10-31 ENCOUNTER — Encounter (INDEPENDENT_AMBULATORY_CARE_PROVIDER_SITE_OTHER): Payer: Self-pay

## 2017-10-31 ENCOUNTER — Other Ambulatory Visit: Payer: Self-pay | Admitting: Cardiology

## 2017-10-31 VITALS — BP 160/68 | HR 64 | Resp 16 | Ht 73.0 in | Wt 218.6 lb

## 2017-10-31 DIAGNOSIS — I10 Essential (primary) hypertension: Secondary | ICD-10-CM | POA: Diagnosis not present

## 2017-10-31 DIAGNOSIS — G4733 Obstructive sleep apnea (adult) (pediatric): Secondary | ICD-10-CM

## 2017-10-31 MED ORDER — AMLODIPINE BESYLATE 10 MG PO TABS: 10.0000 mg | ORAL_TABLET | Freq: Every day | ORAL | 0 refills | Status: DC

## 2017-10-31 NOTE — Progress Notes (Signed)
Cardiology Office Note:    Date:  10/31/2017   ID:  Patrick Henson, DOB 11/13/1940, MRN [id number]  PCP:  Catha GosselinLittle, Kevin, MD  Cardiologist:  Lesleigh NoeHenry W Smith III, MD    Referring MD: Catha GosselinLittle, Kevin, MD   Chief Complaint  Patient presents with  . Sleep Apnea  . Hypertension    History of Present Illness:    Patrick Henson is a 77 y.o. male with a hx of OSA on CPAP, HTN and obesity.  When I last saw him several months ago he had not been using his CPAP because his device was not working and we ordered a new CPAP device.  He is now here for follow-up to document compliance per insurance requirements.He is doing well with his CPAP device.  He tolerates the mask and feels the pressure is adequate.  Since going on CPAP he feels rested in the am but his wife says that he gets sleepy in the morning after breakfast and has to take a nap.  The patient states that he gets up several times a night to urinate.   He denies any significant mouth or nasal dryness or nasal congestion.  He does not think that he snores.     Past Medical History:  Diagnosis Date  . Aortic stenosis    AVA .96 cm2.2011  . Arthritis   . Diabetes mellitus   . Hyperlipidemia   . Hypertension   . Obesity (BMI 30-39.9)   . Obstructive sleep apnea     Past Surgical History:  Procedure Laterality Date  . AORTIC VALVE REPLACEMENT     per patient reports, [facility] and then had rehab  . APPENDECTOMY    . CATARACT EXTRACTION Bilateral   . FEMORAL HERNIA REPAIR    . left knee replacement     2006  . right hip placement     2007  . TONSILLECTOMY      Current Medications: Current Meds  Medication Sig  . amLODipine (NORVASC) 5 MG tablet Take 1.5 tablets (7.5 mg total) by mouth daily.  Marland Kitchen. atorvastatin (LIPITOR) 20 MG tablet Take 20 mg by mouth daily.  . finasteride (PROSCAR) 5 MG tablet Take 5 mg by mouth daily.  Marland Kitchen. lisinopril-hydrochlorothiazide (PRINZIDE,ZESTORETIC) 20-12.5 MG tablet TAKE 1 TABLET BY MOUTH DAILY    . metFORMIN (GLUCOPHAGE) 1000 MG tablet Take 1,000 mg by mouth daily with breakfast.   . metoprolol succinate (TOPROL-XL) 25 MG 24 hr tablet Take 25 mg by mouth daily.     Allergies:   Actos [pioglitazone] and Tradjenta [linagliptin]   Social History   Socioeconomic History  . Marital status: Married    Spouse name: Not on file  . Number of children: 2  . Years of education: Not on file  . Highest education level: Master's degree (e.g., MA, MS, MEng, MEd, MSW, MBA)  Occupational History  . Not on file  Social Needs  . Financial resource strain: Not on file  . Food insecurity:    Worry: Not on file    Inability: Not on file  . Transportation needs:    Medical: Not on file    Non-medical: Not on file  Tobacco Use  . Smoking status: Never Smoker  . Smokeless tobacco: Never Used  Substance and Sexual Activity  . Alcohol use: No  . Drug use: No  . Sexual activity: Not on file  Lifestyle  . Physical activity:    Days per week: Not on file  Minutes per session: Not on file  . Stress: Not on file  Relationships  . Social connections:    Talks on phone: Not on file    Gets together: Not on file    Attends religious service: Not on file    Active member of club or organization: Not on file    Attends meetings of clubs or organizations: Not on file    Relationship status: Not on file  Other Topics Concern  . Not on file  Social History Narrative   Lives at home with his wife   Right handed   Caffeine: 1-2 cokes daily     Family History: The patient's family history includes Cancer in his mother; Diabetes in his mother; Heart attack in his father and mother; Heart disease in his mother; Skin cancer in his sister.  ROS:   Please see the history of present illness.    ROS  All other systems reviewed and negative.   EKGs/Labs/Other Studies Reviewed:    The following studies were reviewed today: PAP download  EKG:  EKG is not ordered today.    Recent  Labs: 08/07/2017: BUN 19; Creatinine, Ser 1.10; Potassium 4.6; Sodium 142 10/04/2017: TSH 2.510   Recent Lipid Panel No results found for: CHOL, TRIG, HDL, CHOLHDL, VLDL, LDLCALC, LDLDIRECT  Physical Exam:    VS:  BP (!) 160/68 (BP Location: Left Arm, Patient Position: Sitting)   Pulse 64   Resp 16   Ht 6\' 1"  (1.854 m)   Wt 218 lb 9.6 oz (99.2 kg)   SpO2 99%   BMI 28.84 kg/m     Wt Readings from Last 3 Encounters:  10/31/17 218 lb 9.6 oz (99.2 kg)  10/04/17 217 lb (98.4 kg)  08/03/17 219 lb (99.3 kg)     GEN:  Well nourished, well developed in no acute distress HEENT: Normal NECK: No JVD; No carotid bruits LYMPHATICS: No lymphadenopathy CARDIAC: RRR, no murmurs, rubs, gallops RESPIRATORY:  Clear to auscultation without rales, wheezing or rhonchi  ABDOMEN: Soft, non-tender, non-distended MUSCULOSKELETAL:  No edema; No deformity  SKIN: Warm and dry NEUROLOGIC:  Alert and oriented x 3 PSYCHIATRIC:  Normal affect   ASSESSMENT:    1. Obstructive sleep apnea   2. Essential hypertension    PLAN:    In order of problems listed above:  1.  OSA - the patient is tolerating PAP therapy well without any problems. The PAP download was reviewed today and showed an AHI of 3.4/hr on 10 cm H2O with 100% compliance in using more than 4 hours nightly.  The patient has been using and benefiting from PAP use and will continue to benefit from therapy.  I think his sleepiness in the morning after he eats breakfast is likely related to getting up several times a night to urinate.  2.  Hypertension - BP is elevated on exam today.  He will continue on Prinzide 20-12.5 mg daily and Toprol-XL 25 mg daily.  I have instructed him to increase amlodipine to 10 mg daily.  He will follow-up with Dr. Michaelle Copas extender in 2 to 3 weeks for BP check.     Medication Adjustments/Labs and Tests Ordered: Current medicines are reviewed at length with the patient today.  Concerns regarding medicines are outlined  above.  No orders of the defined types were placed in this encounter.  No orders of the defined types were placed in this encounter.   Signed, Armanda Magic, MD  10/31/2017 9:13 AM  [facility]

## 2017-10-31 NOTE — Telephone Encounter (Signed)
Sent in at office visit today for #30 with 0 refills. Requesting to change to a ninety day supply. Okay to change rx? Please advise. Thanks, MI

## 2017-10-31 NOTE — Patient Instructions (Signed)
Medication Instructions:  Your physician has recommended you make the following change in your medication:  INCREASE: amlodipine to 10 mg once a day   If you need a refill on your cardiac medications, please contact your pharmacy first.  Labwork: None ordered   Testing/Procedures: None ordered   Follow-Up: Your physician recommends that you schedule a follow-up appointment in: 2-3 weeks with PA.  Your physician wants you to follow-up in: 1 year with Dr. Mayford Knifeurner.  You will receive a reminder letter in the mail two months in advance. If you don't receive a letter, please call our office to schedule the follow-up appointment.  Any Other Special Instructions Will Be Listed Below (If Applicable).   [facility] you [facility]    Lyda PeroneRena Jaz Laningham, RN  [phone]  If you need a refill on your cardiac medications before your next appointment, please call your pharmacy.

## 2017-11-08 ENCOUNTER — Ambulatory Visit
Admission: RE | Admit: 2017-11-08 | Discharge: 2017-11-08 | Disposition: A | Discharge status: Home / Self Care | Payer: Medicare Other | Source: Ambulatory Visit | Attending: Diagnostic Neuroimaging | Admitting: Diagnostic Neuroimaging

## 2017-11-08 DIAGNOSIS — R413 Other amnesia: Secondary | ICD-10-CM

## 2017-11-15 ENCOUNTER — Telehealth: Payer: Self-pay | Admitting: *Deleted

## 2017-11-15 NOTE — Telephone Encounter (Addendum)
Spoke with patient and informed him that his MRI brain showed moderate atrophy which could be related to his memory loss. Advised he be cautious with any activities that could be potentially harmful or dangerous at home, care with driving. His wife, Corrie [place]Mary on [place] then got on phone. This RN repeated same information to her. She is asking about a research trial and stated D Penumalli discussed it with them.  This RN advised will let Dr Marjory LiesPenumalli know, and if he agrees someone from research in this office will call them.  Wife verbalized understanding, appreciation.

## 2017-11-15 NOTE — Telephone Encounter (Signed)
Attempted to reach patient on home phone. Voice mail not activated. Will try mobile #. Called mobile #, no answer and voice mailbox not set up.

## 2017-11-15 NOTE — Telephone Encounter (Signed)
Will refer patient to consider memory loss research trial. -VRP

## 2017-11-17 ENCOUNTER — Encounter: Payer: Self-pay | Admitting: Physician Assistant

## 2017-11-17 NOTE — Progress Notes (Signed)
Cardiology Office Note    Date:  11/21/2017  ID:  [name], DOB [birth year], MRN [id number] PCP:  Catha Gosselin, MD  Cardiologist:  Lesleigh Noe, MD   Chief Complaint: f/u HTN  History of Present Illness:  Patrick Henson is a 77 y.o. male with history of aortic stenosis (s/p bioprosthetic AVR at [facility] 2013), CAD, OSA on CPAP, HTN, obesity, DM, hyperlipidemia, BPPV, memory loss, mildly dilated ascending aorta, first degree AVB/RBBB/LAFB (trifascicular block) who presents for f/u of HTN. Dr. Katrinka Blazing follows his cardiac issues and Dr. Mayford Knife follows his sleep apnea. It appears he had cath done in 07/2011 pre-operatively showing "Heavy coronary calcification with up to 50% eccentric mid to proximal LAD, 50-70% ostial diagonal, 40% first obtuse marginal, and 50-60% proximal codominant RCA. I (Dr. Katrinka Blazing) believe that all of the patient's coronary lesions are less than hemodynamically significant." Last echo 07/2016: EF 60-65%, grade 2 DD, AV bioprosthesis present and functioning normally, mildly elevated AVG (stable), mild AI, mildly dilated ascending aorta, mild MR, mild TR. At OV with Dr. Mayford Knife 10/31/17 blood pressure was elevated so amlodipine was increased to 10mg  daily and he was asked to f/u. Last labs 07/2017 - BMET OK Cr 1.10, 09/2017 - TSH wnl, 05/2017 - LDL 71, 2018 - Hgb 13.6.  He returns for follow-up with his wife of 57 years. He denies any chest pain, dyspnea, dizziness, or syncope. He is following BP at home with a wrist cuff and it appears that he tends to get readings in the 130-140 systolic range at home. Here, he is 110/60 today. He does report easy bruising on his arms that he states PCP is aware of. He was told some people have thinner skin than others. He denies any evidence of bleeding otherwise.   Past Medical History:  Diagnosis Date  . Aortic stenosis    a. s/p bioprosthetic AVR at [facility] 2013.  . Arthritis   . BPPV (benign paroxysmal positional vertigo)   . CAD (coronary artery  disease)    a. cath report 2013: "Heavy coronary calcification with up to 50% eccentric mid to proximal LAD, 50-70% ostial diagonal, 40% first obtuse marginal, and 50-60% proximal codominant RCA. I believe that all of the patient's coronary lesions are less than hemodynamically significant. "  . Diabetes mellitus   . Hyperlipidemia   . Hypertension   . Memory loss   . Mild aortic regurgitation   . Mild mitral regurgitation   . Obesity (BMI 30-39.9)   . Obstructive sleep apnea     Past Surgical History:  Procedure Laterality Date  . AORTIC VALVE REPLACEMENT     per patient reports, performed at [facility] in 2013 and then had rehab  . APPENDECTOMY    . CATARACT EXTRACTION Bilateral   . FEMORAL HERNIA REPAIR    . left knee replacement     2006  . right hip placement     2007  . TONSILLECTOMY      Current Medications: Current Meds  Medication Sig  . amLODipine (NORVASC) 10 MG tablet TAKE 1 TABLET(10 MG) BY MOUTH DAILY  . atorvastatin (LIPITOR) 20 MG tablet Take 20 mg by mouth daily.  . finasteride (PROSCAR) 5 MG tablet Take 5 mg by mouth daily.  Marland Kitchen lisinopril-hydrochlorothiazide (PRINZIDE,ZESTORETIC) 20-12.5 MG tablet TAKE 1 TABLET BY MOUTH DAILY  . metFORMIN (GLUCOPHAGE) 1000 MG tablet Take 1,000 mg by mouth daily with breakfast.   . metoprolol succinate (TOPROL-XL) 25 MG 24 hr tablet Take  25 mg by mouth daily.    Allergies:   Actos [pioglitazone] and Tradjenta [linagliptin]   Social History   Socioeconomic History  . Marital status: Married    Spouse name: Not on file  . Number of children: 2  . Years of education: Not on file  . Highest education level: Master's degree (e.g., MA, MS, MEng, MEd, MSW, MBA)  Occupational History  . Not on file  Social Needs  . Financial resource strain: Not on file  . Food insecurity:    Worry: Not on file    Inability: Not on file  . Transportation needs:    Medical: Not on file    Non-medical: Not on file  Tobacco Use  . Smoking  status: Never Smoker  . Smokeless tobacco: Never Used  Substance and Sexual Activity  . Alcohol use: No  . Drug use: No  . Sexual activity: Not on file  Lifestyle  . Physical activity:    Days per week: Not on file    Minutes per session: Not on file  . Stress: Not on file  Relationships  . Social connections:    Talks on phone: Not on file    Gets together: Not on file    Attends religious service: Not on file    Active member of club or organization: Not on file    Attends meetings of clubs or organizations: Not on file    Relationship status: Not on file  Other Topics Concern  . Not on file  Social History Narrative   Lives at home with his wife   Right handed   Caffeine: 1-2 cokes daily     Family History:  The patient's family history includes Cancer in his mother; Diabetes in his mother; Heart attack in his father and mother; Heart disease in his mother; Skin cancer in his sister.  ROS:   Please see the history of present illness.  All other systems are reviewed and otherwise negative.    PHYSICAL EXAM:   VS:  BP 110/60 (BP Location: Right Arm, Patient Position: Sitting, Cuff Size: Normal)   Pulse 62   Ht 6\' 1"  (1.854 m)   Wt 214 lb (97.1 kg)   SpO2 98%   BMI 28.23 kg/m   BMI: Body mass index is 28.23 kg/m. GEN: Well nourished, well developed WM in no acute distress HEENT: normocephalic, atraumatic Neck: no JVD, carotid bruits, or masses Cardiac: RRR; crisp valve sound, no murmurs, rubs, or gallops, no edema  Respiratory:  clear to auscultation bilaterally, normal work of breathing GI: soft, nontender, nondistended, + BS MS: no deformity or atrophy Skin: warm and dry, no rash, right forearm senile purpura Neuro:  Alert and Oriented x 3, Strength and sensation are intact, follows commands Psych: euthymic mood, full affect  Wt Readings from Last 3 Encounters:  11/21/17 214 lb (97.1 kg)  10/31/17 218 lb 9.6 oz (99.2 kg)  10/04/17 217 lb (98.4 kg)       Studies/Labs Reviewed:   EKG:  EKG was ordered today and personally reviewed by me and demonstrates NSR 63bpm with first degree AVB, RBBB, LAFB (trifascicular block) similar to prior.  Recent Labs: 08/07/2017: BUN 19; Creatinine, Ser 1.10; Potassium 4.6; Sodium 142 10/04/2017: TSH 2.510   Lipid Panel No results found for: CHOL, TRIG, HDL, CHOLHDL, VLDL, LDLCALC, LDLDIRECT  Additional studies/ records that were reviewed today include: Summarized above   ASSESSMENT & PLAN:   1. Essential HTN - BP is controlled today.  He is still getting some readings in the 130-140 range but I am not sure if this is due to a wrist cuff. We discussed possibly obtaining an arm cuff instead for home use. Given his age I would not be much more aggressive with lowering his BP so will continue present regimen. He will monitor for any further declines in BP but overall is feeling well. 2. Aortic valve replacement - Dr. Katrinka Blazing saw him in 07/2017 for this and deemed him stable. Reviewed SBE prophylaxis. 3. OSA on CPAP - per Dr. Mayford Knife. 4. CAD - asymptomatic, continue present regimen. With his easy bruising and history of vertigo, he is not an ideal candidate for aspirin. It doesn't look like Dr.Smith has maintained him on this in recent years. I'll be routing to Dr. Katrinka Blazing to keep him aware. 5. Easy bruising - will check CBC, PT, INR, PTT. If normal will I've told him to discuss back with primary care to review whether referral to heme versus conservative management is indicated. 6. Trifascicular block - per Dr. Katrinka Blazing, puts him at higher risk for needing PPM in the future but no evidence of indication for such at present time. HR WNL.  Disposition: F/u with Dr. Katrinka Blazing and Dr. Mayford Knife was previously outlined (07/2018 and 10/2018 respectively).   Medication Adjustments/Labs and Tests Ordered: Current medicines are reviewed at length with the patient today.  Concerns regarding medicines are outlined above. Medication  changes, Labs and Tests ordered today are summarized above and listed in the Patient Instructions accessible in Encounters.   Signed, Laurann Montana, PA-C  11/21/2017 10:56 AM    [facility] [address] [place], [place], [place]  [address] Phone: [phone]; Fax: [phone]

## 2017-11-21 ENCOUNTER — Ambulatory Visit: Payer: Medicare Other | Admitting: Physician Assistant

## 2017-11-21 ENCOUNTER — Encounter (INDEPENDENT_AMBULATORY_CARE_PROVIDER_SITE_OTHER): Payer: Self-pay

## 2017-11-21 ENCOUNTER — Encounter: Payer: Self-pay | Admitting: Physician Assistant

## 2017-11-21 VITALS — BP 110/60 | HR 62 | Ht 73.0 in | Wt 214.0 lb

## 2017-11-21 DIAGNOSIS — G4733 Obstructive sleep apnea (adult) (pediatric): Secondary | ICD-10-CM

## 2017-11-21 DIAGNOSIS — Z953 Presence of xenogenic heart valve: Secondary | ICD-10-CM

## 2017-11-21 DIAGNOSIS — Z9989 Dependence on other enabling machines and devices: Secondary | ICD-10-CM | POA: Diagnosis not present

## 2017-11-21 DIAGNOSIS — I10 Essential (primary) hypertension: Secondary | ICD-10-CM

## 2017-11-21 DIAGNOSIS — R233 Spontaneous ecchymoses: Secondary | ICD-10-CM

## 2017-11-21 DIAGNOSIS — I2510 Atherosclerotic heart disease of native coronary artery without angina pectoris: Secondary | ICD-10-CM

## 2017-11-21 DIAGNOSIS — R238 Other skin changes: Secondary | ICD-10-CM | POA: Diagnosis not present

## 2017-11-21 LAB — CBC
HEMATOCRIT: 36.3 % — AB (ref 37.5–51.0)
Hemoglobin: 12.7 g/dL — ABNORMAL LOW (ref 13.0–17.7)
MCH: 29.7 pg (ref 26.6–33.0)
MCHC: 35 g/dL (ref 31.5–35.7)
MCV: 85 fL (ref 79–97)
Platelets: 141 10*3/uL — ABNORMAL LOW (ref 150–450)
RBC: 4.28 x10E6/uL (ref 4.14–5.80)
RDW: 13.6 % (ref 12.3–15.4)
WBC: 4.2 10*3/uL (ref 3.4–10.8)

## 2017-11-21 LAB — PROTIME-INR
INR: 1.1 (ref 0.8–1.2)
Prothrombin Time: 11.2 s (ref 9.1–12.0)

## 2017-11-21 LAB — APTT: aPTT: 29 s (ref 24–33)

## 2017-11-21 NOTE — Patient Instructions (Addendum)
Medication Instructions:  Your physician recommends that you continue on your current medications as directed. Please refer to the Current Medication list given to you today.   Labwork: TODAY:  PT/INR, CBC, & PTT  Testing/Procedures: None ordered  Follow-Up: Your physician recommends that you schedule a follow-up appointment in: AS PLANNED    Any Other Special Instructions Will Be Listed Below (If Applicable).  Endocarditis Information  You may be at risk for developing endocarditis since you have  an artificial heart valve  or a repaired heart valve. Endocarditis is an infection of the lining of the heart or heart valves.   Certain surgical and dental procedures may put you at risk,  such as teeth cleaning or other dental procedures or any surgery involving the respiratory, urinary, gastrointestinal tract, gallbladder or prostate.   Notify your doctor or dentist before having any invasive procedures. You will need to take antibiotics before certain procedures.   To prevent endocarditis, maintain good oral health. Seek prompt medical attention for any mouth/gum, skin or urinary tract infections.     If you need a refill on your cardiac medications before your next appointment, please call your pharmacy.

## 2017-12-11 ENCOUNTER — Other Ambulatory Visit: Payer: Self-pay | Admitting: Cardiology

## 2018-04-09 ENCOUNTER — Ambulatory Visit: Payer: Medicare Other | Admitting: Diagnostic Neuroimaging

## 2018-04-09 ENCOUNTER — Encounter: Payer: Self-pay | Admitting: Diagnostic Neuroimaging

## 2018-04-09 VITALS — BP 145/66 | HR 61 | Ht 73.0 in | Wt 221.2 lb

## 2018-04-09 DIAGNOSIS — R413 Other amnesia: Secondary | ICD-10-CM

## 2018-04-09 NOTE — Patient Instructions (Addendum)
MEMORY LOSS (possible mild dementia)  - monitor; optimize nutrition and exercises  - consider memantine medication  - caution with driving and home safety  - contact [facility] [place] [facility] for more information on memory loss / dementia

## 2018-04-09 NOTE — Progress Notes (Signed)
[facility]  PATIENT: Patrick Henson DOB: [birth year]  REFERRING CLINICIAN: Wyline Beady, MD HISTORY FROM: patient and wife  REASON FOR VISIT: follow up    HISTORICAL  CHIEF COMPLAINT:  Chief Complaint  Patient presents with  . Follow-up    6 month follow up. Wife present. Rm 6. No concerns at this time.     HISTORY OF PRESENT ILLNESS:   UPDATE (04/09/18, VRP): Since last visit, doing slightly worse. Symptoms are progressive. Severity is mild. No alleviating or aggravating factors. Tolerating meds.    PRIOR HPI (10/04/17): 77 year old male here for evaluation of vertigo.  Around 2009 patient had intermittent episodes of positional vertigo.  This was treated with repositioning exercises and symptoms resolved.  He would have spinning sensation, typically triggered by turning his head or body.  Sometimes nausea. Similar symptoms returned about 1 to 2 months ago.  No symptoms today.  At the end of this visit, patient's wife mentioned her concern about patient's memory loss.  Patient denies any significant memory problems.  According to wife he has had gradual onset progressive short-term memory loss, confusion, difficulty with driving directions, difficulty with day-to-day activities.  He is able to maintain his own personal hygiene and affairs.  He is having more problems with other more complex tasks.    REVIEW OF SYSTEMS: Full 14 system review of systems performed and negative with exception of: as per HPI.    ALLERGIES: Allergies  Allergen Reactions  . Actos [Pioglitazone] Swelling  . Tradjenta [Linagliptin]     Pt unsure of his reaction    HOME MEDICATIONS: Outpatient Medications Prior to Visit  Medication Sig Dispense Refill  . amLODipine (NORVASC) 10 MG tablet TAKE 1 TABLET(10 MG) BY MOUTH DAILY 90 tablet 3  . atorvastatin (LIPITOR) 20 MG tablet Take 20 mg by mouth daily.    . finasteride (PROSCAR) 5 MG tablet Take 5 mg by mouth daily.    Marland Kitchen  lisinopril-hydrochlorothiazide (PRINZIDE,ZESTORETIC) 20-12.5 MG tablet TAKE 1 TABLET BY MOUTH DAILY 90 tablet 3  . metFORMIN (GLUCOPHAGE) 1000 MG tablet Take 1,000 mg by mouth daily with breakfast.     . metoprolol succinate (TOPROL-XL) 25 MG 24 hr tablet Take 25 mg by mouth daily.     No facility-administered medications prior to visit.     PAST MEDICAL HISTORY: Past Medical History:  Diagnosis Date  . Aortic stenosis    a. s/p bioprosthetic AVR at [facility] 2013.  . Arthritis   . BPPV (benign paroxysmal positional vertigo)   . CAD (coronary artery disease)    a. cath report 2013: "Heavy coronary calcification with up to 50% eccentric mid to proximal LAD, 50-70% ostial diagonal, 40% first obtuse marginal, and 50-60% proximal codominant RCA. I believe that all of the patient's coronary lesions are less than hemodynamically significant. "  . Diabetes mellitus   . Hyperlipidemia   . Hypertension   . Memory loss   . Mild aortic regurgitation   . Mild mitral regurgitation   . Obesity (BMI 30-39.9)   . Obstructive sleep apnea     PAST SURGICAL HISTORY: Past Surgical History:  Procedure Laterality Date  . AORTIC VALVE REPLACEMENT     per patient reports, performed at [facility] in 2013 and then had rehab  . APPENDECTOMY    . CATARACT EXTRACTION Bilateral   . FEMORAL HERNIA REPAIR    . left knee replacement     2006  . right hip placement     2007  .  TONSILLECTOMY      FAMILY HISTORY: Family History  Problem Relation Age of Onset  . Heart attack Mother   . Heart disease Mother   . Diabetes Mother   . Cancer Mother   . Heart attack Father   . Skin cancer Sister     SOCIAL HISTORY:  Social History   Socioeconomic History  . Marital status: Married    Spouse name: Not on file  . Number of children: 2  . Years of education: Not on file  . Highest education level: Master's degree (e.g., MA, MS, MEng, MEd, MSW, MBA)  Occupational History  . Not on file  Social Needs  .  Financial resource strain: Not on file  . Food insecurity:    Worry: Not on file    Inability: Not on file  . Transportation needs:    Medical: Not on file    Non-medical: Not on file  Tobacco Use  . Smoking status: Never Smoker  . Smokeless tobacco: Never Used  Substance and Sexual Activity  . Alcohol use: No  . Drug use: No  . Sexual activity: Not on file  Lifestyle  . Physical activity:    Days per week: Not on file    Minutes per session: Not on file  . Stress: Not on file  Relationships  . Social connections:    Talks on phone: Not on file    Gets together: Not on file    Attends religious service: Not on file    Active member of club or organization: Not on file    Attends meetings of clubs or organizations: Not on file    Relationship status: Not on file  . Intimate partner violence:    Fear of current or ex partner: Not on file    Emotionally abused: Not on file    Physically abused: Not on file    Forced sexual activity: Not on file  Other Topics Concern  . Not on file  Social History Narrative   Lives at home with his wife   Right handed   Caffeine: 1-2 cokes daily     PHYSICAL EXAM  GENERAL EXAM/CONSTITUTIONAL: Vitals:  Vitals:   04/09/18 1349  BP: (!) 145/66  Pulse: 61  Weight: 221 lb 3.2 oz (100.3 kg)  Height: 6\' 1"  (1.854 m)   Body mass index is 29.18 kg/m. No exam data present  Patient is in no distress; well developed, nourished and groomed; neck is supple  CARDIOVASCULAR:  Examination of carotid arteries is normal; no carotid bruits  Regular rate and rhythm, no murmurs  Examination of peripheral vascular system by observation and palpation is normal  EYES:  Ophthalmoscopic exam of optic discs and posterior segments is normal; no papilledema or hemorrhages  MUSCULOSKELETAL:  Gait, strength, tone, movements noted in Neurologic exam below  NEUROLOGIC: MENTAL STATUS:  MMSE - Mini Mental State Exam 10/04/2017  Orientation to time  5  Orientation to Place 4  Registration 3  Attention/ Calculation 5  Recall 0  Language- name 2 objects 2  Language- repeat 1  Language- follow 3 step command 3  Language- read & follow direction 1  Write a sentence 1  Copy design 1  Total score 26    awake, alert, oriented to person, place and time  DECR RECALL  normal attention and concentration  DECR FLUENCY, comprehension intact, naming intact,   fund of knowledge appropriate  CRANIAL NERVE:   2nd - no papilledema on fundoscopic exam  2nd, 3rd, 4th, 6th - pupils equal and reactive to light, visual fields full to confrontation, extraocular muscles intact, no nystagmus  5th - facial sensation symmetric  7th - facial strength symmetric  8th - hearing intact  9th - palate elevates symmetrically, uvula midline  11th - shoulder shrug symmetric  12th - tongue protrusion midline  MOTOR:   normal bulk and tone, full strength in the BUE, BLE  SENSORY:   normal and symmetric to light touch  ABSENT VIB IN FEET  COORDINATION:   finger-nose-finger, fine finger movements normal  REFLEXES:   deep tendon reflexes TRACE and symmetric  GAIT/STATION:   narrow based gait    DIAGNOSTIC DATA (LABS, IMAGING, TESTING) - I reviewed patient records, labs, notes, testing and imaging myself where available.  Lab Results  Component Value Date   WBC 4.2 11/21/2017   HGB 12.7 (L) 11/21/2017   HCT 36.3 (L) 11/21/2017   MCV 85 11/21/2017   PLT 141 (L) 11/21/2017      Component Value Date/Time   NA 142 08/07/2017 1119   K 4.6 08/07/2017 1119   CL 103 08/07/2017 1119   CO2 26 08/07/2017 1119   GLUCOSE 104 (H) 08/07/2017 1119   GLUCOSE 172 (H) 06/23/2015 1114   BUN 19 08/07/2017 1119   CREATININE 1.10 08/07/2017 1119   CREATININE 0.93 06/23/2015 1114   CALCIUM 9.9 08/07/2017 1119   GFRNONAA 64 08/07/2017 1119   GFRAA 73 08/07/2017 1119   No results found for: CHOL, HDL, LDLCALC, LDLDIRECT, TRIG, CHOLHDL No  results found for: HGBA1C Lab Results  Component Value Date   VITAMINB12 905 10/04/2017   Lab Results  Component Value Date   TSH 2.510 10/04/2017    11/08/17 MRI of the brain without contrast shows the following: 1.    Moderate generalized cortical atrophy that is more severe in the mesial temporal lobes. 2.    Mild, age-appropriate, chronic microvascular ischemic changes in the deep white matter of the hemispheres in the right cerebellar hemisphere. 3.    There are no acute findings.    ASSESSMENT AND PLAN  77 y.o. year old male here with intermittent positional vertigo, stable and resolved.  Also with gradual short-term memory loss and confusion, with MMSE of 26, and slight difficulty with instrumental activities of daily living.  Could represent myocardial impairment versus prodromal dementia.    MMSE 26/30  [place] IADL 5/8  Myrtis Ser ADL 6/6   Dx:   1. Memory loss      PLAN:  MEMORY LOSS (possible mild dementia) - monitor; optimize nutrition and exercises - consider memantine; patient will think about it - increase safety, supervision; caregier resources provided - caution with driving and home safety  POSITIONAL VERTIGO (likely BPV) - resolved; monitor for now  Return if symptoms worsen or fail to improve, for return to PCP.    Suanne Marker, MD 04/09/2018, 2:12 PM Certified in Neurology, Neurophysiology and Neuroimaging  [facility] [address] [place], [place] [address] [phone]

## 2018-06-02 DIAGNOSIS — G4733 Obstructive sleep apnea (adult) (pediatric): Secondary | ICD-10-CM | POA: Diagnosis not present

## 2018-06-14 DIAGNOSIS — G4733 Obstructive sleep apnea (adult) (pediatric): Secondary | ICD-10-CM | POA: Diagnosis not present

## 2018-06-19 DIAGNOSIS — B351 Tinea unguium: Secondary | ICD-10-CM | POA: Diagnosis not present

## 2018-06-19 DIAGNOSIS — E1151 Type 2 diabetes mellitus with diabetic peripheral angiopathy without gangrene: Secondary | ICD-10-CM | POA: Diagnosis not present

## 2018-06-21 DIAGNOSIS — Z79899 Other long term (current) drug therapy: Secondary | ICD-10-CM | POA: Diagnosis not present

## 2018-06-21 DIAGNOSIS — E1142 Type 2 diabetes mellitus with diabetic polyneuropathy: Secondary | ICD-10-CM | POA: Diagnosis not present

## 2018-06-22 DIAGNOSIS — R8290 Unspecified abnormal findings in urine: Secondary | ICD-10-CM | POA: Diagnosis not present

## 2018-06-22 DIAGNOSIS — I10 Essential (primary) hypertension: Secondary | ICD-10-CM | POA: Diagnosis not present

## 2018-06-22 DIAGNOSIS — Z79899 Other long term (current) drug therapy: Secondary | ICD-10-CM | POA: Diagnosis not present

## 2018-06-22 DIAGNOSIS — D649 Anemia, unspecified: Secondary | ICD-10-CM | POA: Diagnosis not present

## 2018-06-22 DIAGNOSIS — G4733 Obstructive sleep apnea (adult) (pediatric): Secondary | ICD-10-CM | POA: Diagnosis not present

## 2018-06-22 DIAGNOSIS — E782 Mixed hyperlipidemia: Secondary | ICD-10-CM | POA: Diagnosis not present

## 2018-06-22 DIAGNOSIS — Z0000 Encounter for general adult medical examination without abnormal findings: Secondary | ICD-10-CM | POA: Diagnosis not present

## 2018-06-22 DIAGNOSIS — N289 Disorder of kidney and ureter, unspecified: Secondary | ICD-10-CM | POA: Diagnosis not present

## 2018-06-22 DIAGNOSIS — F0390 Unspecified dementia without behavioral disturbance: Secondary | ICD-10-CM | POA: Diagnosis not present

## 2018-06-22 DIAGNOSIS — E1140 Type 2 diabetes mellitus with diabetic neuropathy, unspecified: Secondary | ICD-10-CM | POA: Diagnosis not present

## 2018-06-22 DIAGNOSIS — Z952 Presence of prosthetic heart valve: Secondary | ICD-10-CM | POA: Diagnosis not present

## 2018-07-03 DIAGNOSIS — G4733 Obstructive sleep apnea (adult) (pediatric): Secondary | ICD-10-CM | POA: Diagnosis not present

## 2018-08-01 DIAGNOSIS — G4733 Obstructive sleep apnea (adult) (pediatric): Secondary | ICD-10-CM | POA: Diagnosis not present

## 2018-08-21 ENCOUNTER — Telehealth: Payer: Self-pay | Admitting: Interventional Cardiology

## 2018-08-21 NOTE — Telephone Encounter (Signed)
Spoke with pt in regards to appt scheduled for 08/30/2018.  Pt denies any cardiac issues.  Rescheduled pt for 12/18/2018.  Advised to call the office sooner if any issues.  Pt verbalized understanding.

## 2018-08-30 ENCOUNTER — Ambulatory Visit: Payer: Medicare Other | Admitting: Interventional Cardiology

## 2018-09-01 DIAGNOSIS — G4733 Obstructive sleep apnea (adult) (pediatric): Secondary | ICD-10-CM | POA: Diagnosis not present

## 2018-09-20 DIAGNOSIS — G4733 Obstructive sleep apnea (adult) (pediatric): Secondary | ICD-10-CM | POA: Diagnosis not present

## 2018-10-01 DIAGNOSIS — G4733 Obstructive sleep apnea (adult) (pediatric): Secondary | ICD-10-CM | POA: Diagnosis not present

## 2018-11-21 ENCOUNTER — Telehealth: Payer: Self-pay | Admitting: Interventional Cardiology

## 2018-11-21 NOTE — Progress Notes (Signed)
Cardiology Office Note:    Date:  11/22/2018   ID:  Patrick SiresJack G Luba, DOB 09/18/1940, MRN [id number]  PCP:  Catha GosselinLittle, Kevin, MD  Cardiologist:  Lesleigh NoeHenry W Smith III, MD   Referring MD: Catha GosselinLittle, Kevin, MD   Chief Complaint  Patient presents with  . Cardiac Valve Problem    History of Present Illness:    Patrick Henson is a 78 y.o. male with a hx of aortic valve [facility], hyperlipidemia, essential hypertension, conduction system disease with sinus bradycardia, fascicular block, and first-degree AV block.  Mr. Harrell Larkesch is doing well.  He denies cardiac complaints.  He clearly has some memory issues.  He is having no side effects to his medications.  He does not notice that he has 1-2+ ankle edema bilaterally.  He denies orthopnea and dyspnea on exertion.  He is relatively sedentary.  Past Medical History:  Diagnosis Date  . Aortic stenosis    a. s/p bioprosthetic AVR at [facility] 2013.  . Arthritis   . BPPV (benign paroxysmal positional vertigo)   . CAD (coronary artery disease)    a. cath report 2013: "Heavy coronary calcification with up to 50% eccentric mid to proximal LAD, 50-70% ostial diagonal, 40% first obtuse marginal, and 50-60% proximal codominant RCA. I believe that all of the patient's coronary lesions are less than hemodynamically significant. "  . Diabetes mellitus   . Hyperlipidemia   . Hypertension   . Memory loss   . Mild aortic regurgitation   . Mild mitral regurgitation   . Obesity (BMI 30-39.9)   . Obstructive sleep apnea     Past Surgical History:  Procedure Laterality Date  . AORTIC VALVE REPLACEMENT     per patient reports, performed at [facility] in 2013 and then had rehab  . APPENDECTOMY    . CATARACT EXTRACTION Bilateral   . FEMORAL HERNIA REPAIR    . left knee replacement     2006  . right hip placement     2007  . TONSILLECTOMY      Current Medications: Current Meds  Medication Sig  . amLODipine (NORVASC) 10 MG tablet TAKE  1 TABLET(10 MG) BY MOUTH DAILY  . amoxicillin (AMOXIL) 500 MG capsule Take 4 capsules by mouth as directed. Prior to dental appointment  . atorvastatin (LIPITOR) 20 MG tablet Take 20 mg by mouth daily.  . finasteride (PROSCAR) 5 MG tablet Take 5 mg by mouth daily.  Marland Kitchen. lisinopril-hydrochlorothiazide (PRINZIDE,ZESTORETIC) 20-12.5 MG tablet TAKE 1 TABLET BY MOUTH DAILY  . metFORMIN (GLUCOPHAGE) 1000 MG tablet Take 1,000 mg by mouth daily with breakfast.   . metoprolol succinate (TOPROL-XL) 25 MG 24 hr tablet Take 25 mg by mouth daily.     Allergies:   Actos [pioglitazone] and Tradjenta [linagliptin]   Social History   Socioeconomic History  . Marital status: Married    Spouse name: Not on file  . Number of children: 2  . Years of education: Not on file  . Highest education level: Master's degree (e.g., MA, MS, MEng, MEd, MSW, MBA)  Occupational History  . Not on file  Social Needs  . Financial resource strain: Not on file  . Food insecurity    Worry: Not on file    Inability: Not on file  . Transportation needs    Medical: Not on file    Non-medical: Not on file  Tobacco Use  . Smoking status: Never Smoker  . Smokeless tobacco: Never Used  Substance  and Sexual Activity  . Alcohol use: No  . Drug use: No  . Sexual activity: Not on file  Lifestyle  . Physical activity    Days per week: Not on file    Minutes per session: Not on file  . Stress: Not on file  Relationships  . Social Musicianconnections    Talks on phone: Not on file    Gets together: Not on file    Attends religious service: Not on file    Active member of club or organization: Not on file    Attends meetings of clubs or organizations: Not on file    Relationship status: Not on file  Other Topics Concern  . Not on file  Social History Narrative   Lives at home with his wife   Right handed   Caffeine: 1-2 cokes daily     Family History: The patient's family history includes Cancer in his mother; Diabetes in  his mother; Heart attack in his father and mother; Heart disease in his mother; Skin cancer in his sister.  ROS:   Please see the history of present illness.    Decreased memory.  All other systems reviewed and are negative.  EKGs/Labs/Other Studies Reviewed:    The following studies were reviewed today: 2D Doppler echocardiogram March 2018: Study Conclusions  - Left ventricle: The cavity size was normal. There was mild   concentric hypertrophy. Systolic function was normal. The   estimated ejection fraction was in the range of 60% to 65%. Wall   motion was normal; there were no regional wall motion   abnormalities. Features are consistent with a pseudonormal left   ventricular filling pattern, with concomitant abnormal relaxation   and increased filling pressure (grade 2 diastolic dysfunction).   Doppler parameters are consistent with high ventricular filling   pressure. - Aortic valve: A bioprosthesis was present and functioning   normally. The mean AVG is mildly elevated at 26mmHg There was   mild regurgitation. Mean gradient (S): 26 mm Hg. - Aorta: Ascending aortic diameter: 39 mm (S). - Ascending aorta: The ascending aorta was mildly dilated. - Mitral valve: Moderately calcified annulus. There was mild   regurgitation. - Left atrium: The atrium was moderately dilated. - Atrial septum: There was increased thickness of the septum,   consistent with lipomatous hypertrophy. - Tricuspid valve: There was mild regurgitation.  Impressions:  - No significant change in mean AV gradient from prior echo (24mm   in 2015 and 26mmHg now)   EKG:  EKG sinus rhythm, first-degree AV block, 298 ms.  Interventricular conduction delay with left axis deviation and right bundle branch block.  Recent Labs: No results found for requested labs within last 8760 hours.  Recent Lipid Panel No results found for: CHOL, TRIG, HDL, CHOLHDL, VLDL, LDLCALC, LDLDIRECT  Physical Exam:    VS:  BP  (!) 146/68   Pulse 64   Ht 6\' 1"  (1.854 m)   Wt 220 lb 12.8 oz (100.2 kg)   SpO2 99%   BMI 29.13 kg/m     Wt Readings from Last 3 Encounters:  11/22/18 220 lb 12.8 oz (100.2 kg)  04/09/18 221 lb 3.2 oz (100.3 kg)  11/21/17 214 lb (97.1 kg)     GEN: Elderly. No acute distress HEENT: Normal NECK: No JVD. LYMPHATICS: No lymphadenopathy CARDIAC: RRR.  2/6 to 3/6 systolic right upper sternal border murmur, no gallop, trace bilateral ankle edema VASCULAR: 2+ bilateral radial and carotid pulses, right carotid bruits RESPIRATORY:  Clear to auscultation without rales, wheezing or rhonchi  ABDOMEN: Soft, non-tender, non-distended, No pulsatile mass, MUSCULOSKELETAL: No deformity  SKIN: Warm and dry NEUROLOGIC:  Alert and oriented x 3 PSYCHIATRIC:  Normal affect   ASSESSMENT:    1. History of aortic valve replacement with bioprosthetic valve   2. OSA on CPAP   3. CAD in native artery   4. Obstructive sleep apnea   5. Essential hypertension   6. Other hyperlipidemia   7. Educated About Covid-19 Virus Infection    PLAN:    In order of problems listed above:  1. Echocardiogram will be done prior to the next office visit in 1 year 2. Encouraged CPAP compliance 3. Secondary prevention discussed.  Metrics on his current risk factors is reasonably good. 4. No evidence of A. fib on exam or EKG 5. LDL target less than 70.  Most recent was 65 in January.  Also at that time his hemoglobin A1c was 5.8.  Overall education and awareness concerning primary/secondary risk prevention was discussed in detail: LDL less than 70, hemoglobin A1c less than 7, blood pressure target less than 130/80 mmHg, >150 minutes of moderate aerobic activity per week, avoidance of smoking, weight control (via diet and exercise), and continued surveillance/management of/for obstructive sleep apnea.  Echocardiogram in 1 year.  Office visit in 1 year.   Medication Adjustments/Labs and Tests Ordered: Current  medicines are reviewed at length with the patient today.  Concerns regarding medicines are outlined above.  Orders Placed This Encounter  Procedures  . EKG 12-Lead  . ECHOCARDIOGRAM COMPLETE   No orders of the defined types were placed in this encounter.   Patient Instructions  Medication Instructions:  Your physician recommends that you continue on your current medications as directed. Please refer to the Current Medication list given to you today.  If you need a refill on your cardiac medications before your next appointment, please call your pharmacy.   Lab work: None If you have labs (blood work) drawn today and your tests are completely normal, you will receive your results only by: Marland Kitchen MyChart Message (if you have MyChart) OR . A paper copy in the mail If you have any lab test that is abnormal or we need to change your treatment, we will call you to review the results.  Testing/Procedures: Your physician has requested that you have an echocardiogram just prior to seeing Dr. Tamala Julian back in June 2021. Echocardiography is a painless test that uses sound waves to create images of your heart. It provides your doctor with information about the size and shape of your heart and how well your heart's chambers and valves are working. This procedure takes approximately one hour. There are no restrictions for this procedure.    Follow-Up: At [facility], you and your health needs are our priority.  As part of our continuing mission to provide you with exceptional heart care, we have created designated Provider Care Teams.  These Care Teams include your primary Cardiologist (physician) and Advanced Practice Providers (APPs -  Physician Assistants and Nurse Practitioners) who all work together to provide you with the care you need, when you need it. You will need a follow up appointment in 12 months.  Please call our office 2 months in advance to schedule this appointment.  You may see Sinclair Grooms, MD or one of the following Advanced Practice Providers on your designated Care Team:   Truitt Merle, NP Cecilie Kicks, NP . Kathyrn Drown,  NP  Any Other Special Instructions Will Be Listed Below (If Applicable).       Signed, Lesleigh NoeHenry W Smith III, MD  11/22/2018 12:07 PM     [facility]

## 2018-11-21 NOTE — Telephone Encounter (Signed)

## 2018-11-22 ENCOUNTER — Ambulatory Visit (INDEPENDENT_AMBULATORY_CARE_PROVIDER_SITE_OTHER): Payer: Medicare HMO | Admitting: Interventional Cardiology

## 2018-11-22 ENCOUNTER — Other Ambulatory Visit: Payer: Self-pay

## 2018-11-22 ENCOUNTER — Encounter: Payer: Self-pay | Admitting: Interventional Cardiology

## 2018-11-22 VITALS — BP 146/68 | HR 64 | Ht 73.0 in | Wt 220.8 lb

## 2018-11-22 DIAGNOSIS — I2510 Atherosclerotic heart disease of native coronary artery without angina pectoris: Secondary | ICD-10-CM

## 2018-11-22 DIAGNOSIS — Z953 Presence of xenogenic heart valve: Secondary | ICD-10-CM | POA: Diagnosis not present

## 2018-11-22 DIAGNOSIS — Z7189 Other specified counseling: Secondary | ICD-10-CM | POA: Diagnosis not present

## 2018-11-22 DIAGNOSIS — I10 Essential (primary) hypertension: Secondary | ICD-10-CM | POA: Diagnosis not present

## 2018-11-22 DIAGNOSIS — G4733 Obstructive sleep apnea (adult) (pediatric): Secondary | ICD-10-CM

## 2018-11-22 DIAGNOSIS — E7849 Other hyperlipidemia: Secondary | ICD-10-CM | POA: Diagnosis not present

## 2018-11-22 DIAGNOSIS — Z9989 Dependence on other enabling machines and devices: Secondary | ICD-10-CM

## 2018-11-22 NOTE — Patient Instructions (Signed)
Medication Instructions:  Your physician recommends that you continue on your current medications as directed. Please refer to the Current Medication list given to you today.  If you need a refill on your cardiac medications before your next appointment, please call your pharmacy.   Lab work: None If you have labs (blood work) drawn today and your tests are completely normal, you will receive your results only by: Marland Kitchen MyChart Message (if you have MyChart) OR . A paper copy in the mail If you have any lab test that is abnormal or we need to change your treatment, we will call you to review the results.  Testing/Procedures: Your physician has requested that you have an echocardiogram just prior to seeing Dr. Tamala Julian back in June 2021. Echocardiography is a painless test that uses sound waves to create images of your heart. It provides your doctor with information about the size and shape of your heart and how well your heart's chambers and valves are working. This procedure takes approximately one hour. There are no restrictions for this procedure.    Follow-Up: At [facility], you and your health needs are our priority.  As part of our continuing mission to provide you with exceptional heart care, we have created designated Provider Care Teams.  These Care Teams include your primary Cardiologist (physician) and Advanced Practice Providers (APPs -  Physician Assistants and Nurse Practitioners) who all work together to provide you with the care you need, when you need it. You will need a follow up appointment in 12 months.  Please call our office 2 months in advance to schedule this appointment.  You may see Sinclair Grooms, MD or one of the following Advanced Practice Providers on your designated Care Team:   Truitt Merle, NP Cecilie Kicks, NP . Kathyrn Drown, NP  Any Other Special Instructions Will Be Listed Below (If Applicable).

## 2018-11-27 DIAGNOSIS — F0390 Unspecified dementia without behavioral disturbance: Secondary | ICD-10-CM | POA: Diagnosis not present

## 2018-11-27 DIAGNOSIS — E782 Mixed hyperlipidemia: Secondary | ICD-10-CM | POA: Diagnosis not present

## 2018-11-27 DIAGNOSIS — E1140 Type 2 diabetes mellitus with diabetic neuropathy, unspecified: Secondary | ICD-10-CM | POA: Diagnosis not present

## 2018-11-27 DIAGNOSIS — I10 Essential (primary) hypertension: Secondary | ICD-10-CM | POA: Diagnosis not present

## 2018-11-27 DIAGNOSIS — I480 Paroxysmal atrial fibrillation: Secondary | ICD-10-CM | POA: Diagnosis not present

## 2018-11-27 DIAGNOSIS — N401 Enlarged prostate with lower urinary tract symptoms: Secondary | ICD-10-CM | POA: Diagnosis not present

## 2018-11-27 DIAGNOSIS — E1142 Type 2 diabetes mellitus with diabetic polyneuropathy: Secondary | ICD-10-CM | POA: Diagnosis not present

## 2018-11-27 DIAGNOSIS — E785 Hyperlipidemia, unspecified: Secondary | ICD-10-CM | POA: Diagnosis not present

## 2018-12-13 DIAGNOSIS — I10 Essential (primary) hypertension: Secondary | ICD-10-CM | POA: Diagnosis not present

## 2018-12-13 DIAGNOSIS — I480 Paroxysmal atrial fibrillation: Secondary | ICD-10-CM | POA: Diagnosis not present

## 2018-12-13 DIAGNOSIS — F0390 Unspecified dementia without behavioral disturbance: Secondary | ICD-10-CM | POA: Diagnosis not present

## 2018-12-13 DIAGNOSIS — N401 Enlarged prostate with lower urinary tract symptoms: Secondary | ICD-10-CM | POA: Diagnosis not present

## 2018-12-13 DIAGNOSIS — E1140 Type 2 diabetes mellitus with diabetic neuropathy, unspecified: Secondary | ICD-10-CM | POA: Diagnosis not present

## 2018-12-13 DIAGNOSIS — Z7984 Long term (current) use of oral hypoglycemic drugs: Secondary | ICD-10-CM | POA: Diagnosis not present

## 2018-12-13 DIAGNOSIS — E785 Hyperlipidemia, unspecified: Secondary | ICD-10-CM | POA: Diagnosis not present

## 2018-12-18 ENCOUNTER — Ambulatory Visit: Payer: Self-pay | Admitting: Interventional Cardiology

## 2018-12-19 DIAGNOSIS — Z20828 Contact with and (suspected) exposure to other viral communicable diseases: Secondary | ICD-10-CM | POA: Diagnosis not present

## 2018-12-21 ENCOUNTER — Other Ambulatory Visit: Payer: Self-pay | Admitting: *Deleted

## 2018-12-21 MED ORDER — AMLODIPINE BESYLATE 10 MG PO TABS: ORAL_TABLET | ORAL | 0 refills | Status: DC

## 2019-01-20 ENCOUNTER — Other Ambulatory Visit: Payer: Self-pay | Admitting: Cardiology

## 2019-02-06 ENCOUNTER — Telehealth: Payer: Self-pay | Admitting: Interventional Cardiology

## 2019-02-06 NOTE — Telephone Encounter (Signed)
  Wife is calling because her husband is going to have a tooth extraction and he is on antibiotic but she wants to know if there is anything else he needs to do.

## 2019-02-06 NOTE — Telephone Encounter (Signed)
Spoke with wife, DPR on file. Advised take antibiotics and that's it. Wife appreciative for call.

## 2019-03-12 DIAGNOSIS — Z23 Encounter for immunization: Secondary | ICD-10-CM | POA: Diagnosis not present

## 2019-03-25 DIAGNOSIS — N401 Enlarged prostate with lower urinary tract symptoms: Secondary | ICD-10-CM | POA: Diagnosis not present

## 2019-03-25 DIAGNOSIS — F0390 Unspecified dementia without behavioral disturbance: Secondary | ICD-10-CM | POA: Diagnosis not present

## 2019-03-25 DIAGNOSIS — E1142 Type 2 diabetes mellitus with diabetic polyneuropathy: Secondary | ICD-10-CM | POA: Diagnosis not present

## 2019-03-25 DIAGNOSIS — I10 Essential (primary) hypertension: Secondary | ICD-10-CM | POA: Diagnosis not present

## 2019-03-25 DIAGNOSIS — E1140 Type 2 diabetes mellitus with diabetic neuropathy, unspecified: Secondary | ICD-10-CM | POA: Diagnosis not present

## 2019-03-25 DIAGNOSIS — E785 Hyperlipidemia, unspecified: Secondary | ICD-10-CM | POA: Diagnosis not present

## 2019-04-16 ENCOUNTER — Telehealth: Payer: Self-pay

## 2019-04-16 NOTE — Telephone Encounter (Signed)

## 2019-04-17 ENCOUNTER — Encounter: Payer: Self-pay | Admitting: Cardiology

## 2019-04-17 ENCOUNTER — Telehealth (INDEPENDENT_AMBULATORY_CARE_PROVIDER_SITE_OTHER): Payer: Medicare HMO | Admitting: Cardiology

## 2019-04-17 ENCOUNTER — Other Ambulatory Visit: Payer: Self-pay

## 2019-04-17 VITALS — BP 146/70 | Ht 73.0 in | Wt 212.0 lb

## 2019-04-17 DIAGNOSIS — I10 Essential (primary) hypertension: Secondary | ICD-10-CM | POA: Diagnosis not present

## 2019-04-17 DIAGNOSIS — G4733 Obstructive sleep apnea (adult) (pediatric): Secondary | ICD-10-CM | POA: Diagnosis not present

## 2019-04-17 NOTE — Progress Notes (Signed)
Virtual Visit via Telephone Note   This visit type was conducted due to national recommendations for restrictions regarding the COVID-19 Pandemic (e.g. social distancing) in an effort to limit this patient's exposure and mitigate transmission in our community.  Due to his co-morbid illnesses, this patient is at least at moderate risk for complications without adequate follow up.  This format is felt to be most appropriate for this patient at this time.  All issues noted in this document were discussed and addressed.  A limited physical exam was performed with this format.  Please refer to the patient's chart for his consent to telehealth for [facility].   Evaluation Performed:  Follow-up visit  This visit type was conducted due to national recommendations for restrictions regarding the COVID-19 Pandemic (e.g. social distancing).  This format is felt to be most appropriate for this patient at this time.  All issues noted in this document were discussed and addressed.  No physical exam was performed (except for noted visual exam findings with Video Visits).  Please refer to the patient's chart (MyChart message for video visits and phone note for telephone visits) for the patient's consent to telehealth for [facility].  Date:  04/17/2019   ID:  Patrick Henson, DOB 05/24/1941, MRN [id number]  Patient Location:  Home  Provider location:   [place]  PCP:  Catha GosselinLittle, Kevin, MD  Cardiologist:  Lesleigh NoeHenry W Smith III, MD  Sleep Medicine:  Armanda Magicraci Dewanna Hurston, MD Electrophysiologist:  None   Chief Complaint:  OSA and HTN  History of Present Illness:    Patrick Henson is a 78 y.o. male who presents via audio/video conferencing for a telehealth visit today.    He is doing well with his CPAP device and thinks that he has gotten used to it.  He tolerates the nasal mask except for occasional leakiness and feels the pressure is adequate.  He still feels fatigued when he gets up in the am and gets sleepy during  the day and naps some.  He goes to sleep within 2-3 minutes.   He denies any significant mouth or nasal dryness or nasal congestion.  He does not think that he snores.    The patient does not have symptoms concerning for COVID-19 infection (fever, chills, cough, or new shortness of breath).    Prior CV studies:   The following studies were reviewed today:  PAP compliance download  Past Medical History:  Diagnosis Date  . Aortic stenosis    a. s/[facility] 2013.  . Arthritis   . BPPV (benign paroxysmal positional vertigo)   . CAD (coronary artery disease)    a. cath report 2013: "Heavy coronary calcification with up to 50% eccentric mid to proximal LAD, 50-70% ostial diagonal, 40% first obtuse marginal, and 50-60% proximal codominant RCA. I believe that all of the patient's coronary lesions are less than hemodynamically significant. "  . Diabetes mellitus   . Hyperlipidemia   . Hypertension   . Memory loss   . Mild aortic regurgitation   . Mild mitral regurgitation   . Obesity (BMI 30-39.9)   . Obstructive sleep apnea    Past Surgical History:  Procedure Laterality Date  . AORTIC VALVE REPLACEMENT     per patient reports, [facility] and then had rehab  . APPENDECTOMY    . CATARACT EXTRACTION Bilateral   . FEMORAL HERNIA REPAIR    . left knee replacement     2006  .  right hip placement     2007  . TONSILLECTOMY       Current Meds  Medication Sig  . amLODipine (NORVASC) 10 MG tablet Take 1 tablet (10 mg total) by mouth daily. TAKE 1 TABLET(10 MG) BY MOUTH DAILY  . amoxicillin (AMOXIL) 500 MG capsule Take 4 capsules by mouth as directed. Prior to dental appointment  . atorvastatin (LIPITOR) 20 MG tablet Take 20 mg by mouth daily.  . cholecalciferol (VITAMIN D3) 25 MCG (1000 UT) tablet Take 1,000 Units by mouth daily.  . finasteride (PROSCAR) 5 MG tablet Take 5 mg by mouth daily.  Marland Kitchen lisinopril-hydrochlorothiazide (PRINZIDE,ZESTORETIC) 20-12.5  MG tablet TAKE 1 TABLET BY MOUTH DAILY  . metFORMIN (GLUCOPHAGE) 1000 MG tablet Take 1,000 mg by mouth daily with breakfast.   . metoprolol succinate (TOPROL-XL) 25 MG 24 hr tablet Take 25 mg by mouth daily.  . vitamin B-12 (CYANOCOBALAMIN) 1000 MCG tablet Take 1,000 mcg by mouth daily.     Allergies:   Actos [pioglitazone] and Tradjenta [linagliptin]   Social History   Tobacco Use  . Smoking status: Never Smoker  . Smokeless tobacco: Never Used  Substance Use Topics  . Alcohol use: No  . Drug use: No     Family Hx: The patient's family history includes Cancer in his mother; Diabetes in his mother; Heart attack in his father and mother; Heart disease in his mother; Skin cancer in his sister.  ROS:   Please see the history of present illness.     All other systems reviewed and are negative.   Labs/Other Tests and Data Reviewed:    Recent Labs: No results found for requested labs within last 8760 hours.   Recent Lipid Panel No results found for: CHOL, TRIG, HDL, CHOLHDL, LDLCALC, LDLDIRECT  Wt Readings from Last 3 Encounters:  04/17/19 212 lb (96.2 kg)  11/22/18 220 lb 12.8 oz (100.2 kg)  04/09/18 221 lb 3.2 oz (100.3 kg)     Objective:    Vital Signs:  BP (!) 146/70   Ht 6\' 1"  (1.854 m)   Wt 212 lb (96.2 kg)   BMI 27.97 kg/m     ASSESSMENT & PLAN:    1.  OSA -  The patient is tolerating PAP therapy well without any problems. The PAP download was reviewed today and showed an AHI of 3.8/hr on 10 cm H2O with 80% compliance in using more than 4 hours nightly.  The patient has been using and benefiting from PAP use and will continue to benefit from therapy.   2.  HTN -BP controlled on exam -continue amlodipine 10mg  daily, Lisinopril HCT 20-12.5mg  daily and Toprol XL 25mg  daily.    COVID-19 Education: The signs and symptoms of COVID-19 were discussed with the patient and how to seek care for testing (follow up with PCP or arrange E-visit).  The importance of  social distancing was discussed today.  Patient Risk:   After full review of this patient's clinical status, I feel that they are at least moderate risk at this time.  Time:   Today, I have spent 20 minutes directly with the patient on telemedicine discussing medical problems including OSA, HTN.  We also reviewed the symptoms of COVID 19 and the ways to protect against contracting the virus with telehealth technology.  I spent an additional 5 minutes reviewing patient's chart including PAP compliance download and labs.  Medication Adjustments/Labs and Tests Ordered: Current medicines are reviewed at length with the patient today.  Concerns regarding medicines are outlined above.  Tests Ordered: No orders of the defined types were placed in this encounter.  Medication Changes: No orders of the defined types were placed in this encounter.   Disposition:  Follow up 1 year  Signed, Armanda Magic, MD  04/17/2019 9:11 AM     [facility]

## 2019-04-24 DIAGNOSIS — G4733 Obstructive sleep apnea (adult) (pediatric): Secondary | ICD-10-CM | POA: Diagnosis not present

## 2019-06-04 ENCOUNTER — Telehealth: Payer: Self-pay | Admitting: *Deleted

## 2019-06-04 NOTE — Telephone Encounter (Signed)
Informed patient of compliance results and verbalized understanding was indicated. Patient is aware and agreeable to AHI being within range at 3.4. Patient is aware and agreeable to being in compliance with machine usage. Patient is aware and agreeable to no change in current pressures. 

## 2019-06-04 NOTE — Telephone Encounter (Signed)
-----   Message from Quintella Reichert, MD sent at 06/04/2019  4:18 PM EST ----- Good AHI and compliance.  Continue current PAP settings.

## 2019-06-24 DIAGNOSIS — E1140 Type 2 diabetes mellitus with diabetic neuropathy, unspecified: Secondary | ICD-10-CM | POA: Diagnosis not present

## 2019-06-24 DIAGNOSIS — E782 Mixed hyperlipidemia: Secondary | ICD-10-CM | POA: Diagnosis not present

## 2019-06-24 DIAGNOSIS — N401 Enlarged prostate with lower urinary tract symptoms: Secondary | ICD-10-CM | POA: Diagnosis not present

## 2019-06-24 DIAGNOSIS — I480 Paroxysmal atrial fibrillation: Secondary | ICD-10-CM | POA: Diagnosis not present

## 2019-06-24 DIAGNOSIS — I10 Essential (primary) hypertension: Secondary | ICD-10-CM | POA: Diagnosis not present

## 2019-06-24 DIAGNOSIS — E785 Hyperlipidemia, unspecified: Secondary | ICD-10-CM | POA: Diagnosis not present

## 2019-06-24 DIAGNOSIS — F0390 Unspecified dementia without behavioral disturbance: Secondary | ICD-10-CM | POA: Diagnosis not present

## 2019-06-24 DIAGNOSIS — E1142 Type 2 diabetes mellitus with diabetic polyneuropathy: Secondary | ICD-10-CM | POA: Diagnosis not present

## 2019-07-05 DIAGNOSIS — Z0000 Encounter for general adult medical examination without abnormal findings: Secondary | ICD-10-CM | POA: Diagnosis not present

## 2019-07-05 DIAGNOSIS — D649 Anemia, unspecified: Secondary | ICD-10-CM | POA: Diagnosis not present

## 2019-07-05 DIAGNOSIS — E1140 Type 2 diabetes mellitus with diabetic neuropathy, unspecified: Secondary | ICD-10-CM | POA: Diagnosis not present

## 2019-07-05 DIAGNOSIS — G4733 Obstructive sleep apnea (adult) (pediatric): Secondary | ICD-10-CM | POA: Diagnosis not present

## 2019-07-05 DIAGNOSIS — E782 Mixed hyperlipidemia: Secondary | ICD-10-CM | POA: Diagnosis not present

## 2019-07-05 DIAGNOSIS — I480 Paroxysmal atrial fibrillation: Secondary | ICD-10-CM | POA: Diagnosis not present

## 2019-07-05 DIAGNOSIS — F0390 Unspecified dementia without behavioral disturbance: Secondary | ICD-10-CM | POA: Diagnosis not present

## 2019-07-05 DIAGNOSIS — I10 Essential (primary) hypertension: Secondary | ICD-10-CM | POA: Diagnosis not present

## 2019-07-05 DIAGNOSIS — N401 Enlarged prostate with lower urinary tract symptoms: Secondary | ICD-10-CM | POA: Diagnosis not present

## 2019-07-05 DIAGNOSIS — Z952 Presence of prosthetic heart valve: Secondary | ICD-10-CM | POA: Diagnosis not present

## 2019-08-14 DIAGNOSIS — N401 Enlarged prostate with lower urinary tract symptoms: Secondary | ICD-10-CM | POA: Diagnosis not present

## 2019-08-14 DIAGNOSIS — R350 Frequency of micturition: Secondary | ICD-10-CM | POA: Diagnosis not present

## 2019-08-29 DIAGNOSIS — E1140 Type 2 diabetes mellitus with diabetic neuropathy, unspecified: Secondary | ICD-10-CM | POA: Diagnosis not present

## 2019-08-29 DIAGNOSIS — F321 Major depressive disorder, single episode, moderate: Secondary | ICD-10-CM | POA: Diagnosis not present

## 2019-08-29 DIAGNOSIS — E785 Hyperlipidemia, unspecified: Secondary | ICD-10-CM | POA: Diagnosis not present

## 2019-08-29 DIAGNOSIS — N401 Enlarged prostate with lower urinary tract symptoms: Secondary | ICD-10-CM | POA: Diagnosis not present

## 2019-08-29 DIAGNOSIS — E1142 Type 2 diabetes mellitus with diabetic polyneuropathy: Secondary | ICD-10-CM | POA: Diagnosis not present

## 2019-08-29 DIAGNOSIS — I480 Paroxysmal atrial fibrillation: Secondary | ICD-10-CM | POA: Diagnosis not present

## 2019-08-29 DIAGNOSIS — I10 Essential (primary) hypertension: Secondary | ICD-10-CM | POA: Diagnosis not present

## 2019-08-29 DIAGNOSIS — E782 Mixed hyperlipidemia: Secondary | ICD-10-CM | POA: Diagnosis not present

## 2019-08-29 DIAGNOSIS — F0390 Unspecified dementia without behavioral disturbance: Secondary | ICD-10-CM | POA: Diagnosis not present

## 2019-09-16 DIAGNOSIS — N401 Enlarged prostate with lower urinary tract symptoms: Secondary | ICD-10-CM | POA: Diagnosis not present

## 2019-09-16 DIAGNOSIS — R350 Frequency of micturition: Secondary | ICD-10-CM | POA: Diagnosis not present

## 2019-10-29 DIAGNOSIS — R311 Benign essential microscopic hematuria: Secondary | ICD-10-CM | POA: Diagnosis not present

## 2019-10-29 DIAGNOSIS — R350 Frequency of micturition: Secondary | ICD-10-CM | POA: Diagnosis not present

## 2019-10-29 DIAGNOSIS — R3121 Asymptomatic microscopic hematuria: Secondary | ICD-10-CM | POA: Diagnosis not present

## 2019-10-29 DIAGNOSIS — N401 Enlarged prostate with lower urinary tract symptoms: Secondary | ICD-10-CM | POA: Diagnosis not present

## 2019-11-05 ENCOUNTER — Telehealth: Payer: Self-pay | Admitting: Interventional Cardiology

## 2019-11-05 NOTE — Telephone Encounter (Signed)
Pt c/o of Chest Pain: STAT if CP now or developed within 24 hours  1. Are you having CP right now? No   2. Are you experiencing any other symptoms (ex. SOB, nausea, vomiting, sweating)? Fatigue    3. How long have you been experiencing CP? Past 6 months in last 2 weeks it's been more often   4. Is your CP continuous or coming and going? Coming and going   5. Have you taken Nitroglycerin? No    Patrick Henson the patient's wife is calling stating Patrick Henson has been having more frequent episodes of what he calls a tickle in his chest for the past two weeks. She states he doesn't report any other symptoms when it occurs, but she has noticed he has been very tired lately. They are wanting to know if Patrick Henson should schedule an appointment to come in and be seen in regards to this. Please advise.

## 2019-11-05 NOTE — Telephone Encounter (Signed)
Spoke with pt and he states that over the last 6 months he has noticed a "tingling"/"bubbling" feeling in his chest.  This has worsened over the last two weeks.  Denies any pain, pressure or discomfort.  Denies SOB, HA, blurrred vision or other issues.  Vitals this morning were 141/69, 59.  Only lasts a short period of time and goes away.  Pt also mentioned that he has trouble swallowing.  Food gets hung and takes a bit to get it down.  Advised pt this does not sound cardiac related and reach out to PCP.   Pt agreeable to plan.

## 2019-11-11 DIAGNOSIS — E1142 Type 2 diabetes mellitus with diabetic polyneuropathy: Secondary | ICD-10-CM | POA: Diagnosis not present

## 2019-11-11 DIAGNOSIS — F321 Major depressive disorder, single episode, moderate: Secondary | ICD-10-CM | POA: Diagnosis not present

## 2019-11-11 DIAGNOSIS — I480 Paroxysmal atrial fibrillation: Secondary | ICD-10-CM | POA: Diagnosis not present

## 2019-11-11 DIAGNOSIS — I10 Essential (primary) hypertension: Secondary | ICD-10-CM | POA: Diagnosis not present

## 2019-11-11 DIAGNOSIS — E785 Hyperlipidemia, unspecified: Secondary | ICD-10-CM | POA: Diagnosis not present

## 2019-11-11 DIAGNOSIS — N401 Enlarged prostate with lower urinary tract symptoms: Secondary | ICD-10-CM | POA: Diagnosis not present

## 2019-11-11 DIAGNOSIS — F0390 Unspecified dementia without behavioral disturbance: Secondary | ICD-10-CM | POA: Diagnosis not present

## 2019-11-11 DIAGNOSIS — E1140 Type 2 diabetes mellitus with diabetic neuropathy, unspecified: Secondary | ICD-10-CM | POA: Diagnosis not present

## 2019-11-11 DIAGNOSIS — E782 Mixed hyperlipidemia: Secondary | ICD-10-CM | POA: Diagnosis not present

## 2019-11-15 DIAGNOSIS — R1319 Other dysphagia: Secondary | ICD-10-CM | POA: Diagnosis not present

## 2019-12-13 DIAGNOSIS — E1142 Type 2 diabetes mellitus with diabetic polyneuropathy: Secondary | ICD-10-CM | POA: Diagnosis not present

## 2019-12-13 DIAGNOSIS — E782 Mixed hyperlipidemia: Secondary | ICD-10-CM | POA: Diagnosis not present

## 2019-12-13 DIAGNOSIS — I10 Essential (primary) hypertension: Secondary | ICD-10-CM | POA: Diagnosis not present

## 2019-12-13 DIAGNOSIS — N401 Enlarged prostate with lower urinary tract symptoms: Secondary | ICD-10-CM | POA: Diagnosis not present

## 2019-12-13 DIAGNOSIS — F0390 Unspecified dementia without behavioral disturbance: Secondary | ICD-10-CM | POA: Diagnosis not present

## 2019-12-13 DIAGNOSIS — F321 Major depressive disorder, single episode, moderate: Secondary | ICD-10-CM | POA: Diagnosis not present

## 2019-12-13 DIAGNOSIS — I480 Paroxysmal atrial fibrillation: Secondary | ICD-10-CM | POA: Diagnosis not present

## 2019-12-13 DIAGNOSIS — E785 Hyperlipidemia, unspecified: Secondary | ICD-10-CM | POA: Diagnosis not present

## 2019-12-13 DIAGNOSIS — E1140 Type 2 diabetes mellitus with diabetic neuropathy, unspecified: Secondary | ICD-10-CM | POA: Diagnosis not present

## 2020-01-14 ENCOUNTER — Ambulatory Visit (HOSPITAL_COMMUNITY): Payer: Medicare HMO | Attending: Cardiology

## 2020-01-14 ENCOUNTER — Other Ambulatory Visit: Payer: Self-pay

## 2020-01-14 DIAGNOSIS — Z953 Presence of xenogenic heart valve: Secondary | ICD-10-CM | POA: Diagnosis not present

## 2020-01-14 LAB — ECHOCARDIOGRAM COMPLETE
AR max vel: 1.2 cm2
AV Area VTI: 1.26 cm2
AV Area mean vel: 1.26 cm2
AV Mean grad: 15.8 mmHg
AV Peak grad: 28.1 mmHg
Ao pk vel: 2.65 m/s
Area-P 1/2: 2.31 cm2
P 1/2 time: 100 msec
S' Lateral: 3.1 cm

## 2020-01-15 NOTE — Progress Notes (Signed)
Cardiology Office Note:    Date:  01/16/2020   ID:  Patrick Henson, DOB [birth year], MRN [id number]  PCP:  Catha Gosselin, MD  Cardiologist:  Lesleigh Noe, MD   Referring MD: Catha Gosselin, MD   Chief Complaint  Patient presents with  . Coronary Artery Disease  . Cardiac Valve Problem  . Advice Only    Trifascicular block    History of Present Illness:    Patrick Henson is a 79 y.o. male with a hx of  OSA/CPAP, aortic valve replacement 2013at [facility], hyperlipidemia, essential hypertension, conduction system disease with sinus bradycardia, bifascicular block, and first-degree AV block.  He is accompanied by his wife.  She is concerned because he sits in his chair all day.  He sleeps a fair amount.  She states that within an hour or 2 of awakening he is napping in the chair.  He denies syncope, exertional dyspnea, orthopnea, PND, but does have bilateral lower extremity edema.  Past Medical History:  Diagnosis Date  . Aortic stenosis    a. s/p bioprosthetic AVR at [facility] 2013.  . Arthritis   . BPPV (benign paroxysmal positional vertigo)   . CAD (coronary artery disease)    a. cath report 2013: "Heavy coronary calcification with up to 50% eccentric mid to proximal LAD, 50-70% ostial diagonal, 40% first obtuse marginal, and 50-60% proximal codominant RCA. I believe that all of the patient's coronary lesions are less than hemodynamically significant. "  . Diabetes mellitus   . Hyperlipidemia   . Hypertension   . Memory loss   . Mild aortic regurgitation   . Mild mitral regurgitation   . Obesity (BMI 30-39.9)   . Obstructive sleep apnea     Past Surgical History:  Procedure Laterality Date  . AORTIC VALVE REPLACEMENT     per patient reports, performed at [facility] in 2013 and then had rehab  . APPENDECTOMY    . CATARACT EXTRACTION Bilateral   . FEMORAL HERNIA REPAIR    . left knee replacement     2006  . right hip placement     2007  . TONSILLECTOMY       Current Medications: Current Meds  Medication Sig  . amLODipine (NORVASC) 10 MG tablet Take 1 tablet (10 mg total) by mouth daily. TAKE 1 TABLET(10 MG) BY MOUTH DAILY  . amoxicillin (AMOXIL) 500 MG capsule Take 4 capsules by mouth as directed. Prior to dental appointment  . atorvastatin (LIPITOR) 20 MG tablet Take 20 mg by mouth daily.  . cholecalciferol (VITAMIN D3) 25 MCG (1000 UT) tablet Take 1,000 Units by mouth daily.  Marland Kitchen lisinopril (ZESTRIL) 20 MG tablet Take 20 mg by mouth daily.  . metFORMIN (GLUCOPHAGE) 1000 MG tablet Take 1,000 mg by mouth daily with breakfast.   . metoprolol succinate (TOPROL-XL) 25 MG 24 hr tablet Take 25 mg by mouth daily.  . sildenafil (VIAGRA) 100 MG tablet Take 100 mg by mouth daily as needed for erectile dysfunction.  . vitamin B-12 (CYANOCOBALAMIN) 1000 MCG tablet Take 1,000 mcg by mouth daily.     Allergies:   Actos [pioglitazone] and Tradjenta [linagliptin]   Social History   Socioeconomic History  . Marital status: Married    Spouse name: Not on file  . Number of children: 2  . Years of education: Not on file  . Highest education level: Master's degree (e.g., MA, MS, MEng, MEd, MSW, MBA)  Occupational History  . Not on file  Tobacco Use  . Smoking status: Never Smoker  . Smokeless tobacco: Never Used  Vaping Use  . Vaping Use: Never used  Substance and Sexual Activity  . Alcohol use: No  . Drug use: No  . Sexual activity: Not on file  Other Topics Concern  . Not on file  Social History Narrative   Lives at home with his wife   Right handed   Caffeine: 1-2 cokes daily   Social Determinants of Health   Financial Resource Strain:   . Difficulty of Paying Living Expenses: Not on file  Food Insecurity:   . Worried About Programme researcher, broadcasting/film/video in the Last Year: Not on file  . Ran Out of Food in the Last Year: Not on file  Transportation Needs:   . Lack of Transportation (Medical): Not on file  . Lack of Transportation (Non-Medical):  Not on file  Physical Activity:   . Days of Exercise per Week: Not on file  . Minutes of Exercise per Session: Not on file  Stress:   . Feeling of Stress : Not on file  Social Connections:   . Frequency of Communication with Friends and Family: Not on file  . Frequency of Social Gatherings with Friends and Family: Not on file  . Attends Religious Services: Not on file  . Active Member of Clubs or Organizations: Not on file  . Attends Banker Meetings: Not on file  . Marital Status: Not on file     Family History: The patient's family history includes Cancer in his mother; Diabetes in his mother; Heart attack in his father and mother; Heart disease in his mother; Skin cancer in his sister.  ROS:   Please see the history of present illness.    He has sleep apnea.  He is perhaps 60% compliant.  He does not feel he needs it.  All other systems reviewed and are negative.  EKGs/Labs/Other Studies Reviewed:    The following studies were reviewed today: 2D Doppler echocardiogram 2021:\ IMPRESSIONS    1. Left ventricular ejection fraction, by estimation, is 65 to 70%. The  left ventricle has normal function. The left ventricle has no regional  wall motion abnormalities. Left ventricular diastolic parameters are  consistent with Grade I diastolic  dysfunction (impaired relaxation).  2. Right ventricular systolic function is normal. The right ventricular  size is normal. There is normal pulmonary artery systolic pressure. The  estimated right ventricular systolic pressure is 27.8 mmHg.  3. Left atrial size was mildly dilated.  4. The mitral valve is abnormal. Trivial mitral valve regurgitation.  5. The aortic valve has been repaired/replaced. Aortic valve  regurgitation is trivial. There is a bioprosthetic valve present in the  aortic position. Procedure Date: 2013. Aortic valve mean gradient measures  15.8 mmHg. Aortic valve Vmax measures 2.65  m/s.  6. Aortic  dilatation noted. There is mild dilatation of the ascending  aorta measuring 39 mm.  7. The inferior vena cava is normal in size with greater than 50%  respiratory variability, suggesting right atrial pressure of 3 mmHg.   Comparison(s): Changes from prior study are noted. 08/19/2016: LVEF 60-65%,  bioprosthetic AVR, 26 mmHg mean gradient, mild AI.  EKG:  EKG normal sinus rhythm with first-degree AV block., right bundle branch block, left anterior hemiblock,  Recent Labs: No results found for requested labs within last 8760 hours.  Recent Lipid Panel No results found for: CHOL, TRIG, HDL, CHOLHDL, VLDL, LDLCALC, LDLDIRECT  Physical Exam:  VS:  BP (!) 142/62   Pulse 60   Ht 6\' 1"  (1.854 m)   Wt 220 lb (99.8 kg)   SpO2 98%   BMI 29.03 kg/m     Wt Readings from Last 3 Encounters:  01/16/20 220 lb (99.8 kg)  04/17/19 212 lb (96.2 kg)  11/22/18 220 lb 12.8 oz (100.2 kg)     GEN: Elderly, Mast.. No acute distress HEENT: Normal NECK: No JVD. LYMPHATICS: No lymphadenopathy CARDIAC:  RRR with 1-2 over 6 right upper sternal systolic murmur, there is no gallop..  There is bilateral 1-2+ pitting edema. VASCULAR:  Normal Pulses. No bruits. RESPIRATORY:  Clear to auscultation without rales, wheezing or rhonchi  ABDOMEN: Soft, non-tender, non-distended, No pulsatile mass, MUSCULOSKELETAL: No deformity  SKIN: Warm and dry NEUROLOGIC:  Alert and oriented x 3 PSYCHIATRIC:  Normal affect   ASSESSMENT:    1. History of aortic valve replacement with bioprosthetic valve   2. Obstructive sleep apnea   3. Essential hypertension   4. CAD in native artery   5. Other hyperlipidemia   6. Educated about COVID-19 virus infection    PLAN:    In order of problems listed above:  1. Normally functioning prosthetic aortic valve based on echo. 2. Because of lassitude and decreased energy, I wonder if he is getting adequate therapy from CPAP.  I encouraged a follow-up appointment with Dr.  11/24/18.  I encouraged increased compliance with CPAP from his current 60% estimate. 3. Excellent blood pressure control 4. Stable without angina.  Risk prevention discussed. 5. Most recent LDL was 66 in February 2021. 6. He has trifascicular block.  This is slowly progressive with PR interval now greater than 300 ms.  48-hour monitor to to determine if the patient has adequate heart rate response with activity and to exclude higher degrees of AV block at rest. He is not currently on therapy that impairs function in the AV node. 7. He has had COVID-19 vaccine.  He is stable.  Overall education and awareness concerning primary/secondary risk prevention was discussed in detail: LDL less than 70, hemoglobin A1c less than 7, blood pressure target less than 130/80 mmHg, >150 minutes of moderate aerobic activity per week, avoidance of smoking, weight control (via diet and exercise), and continued surveillance/management of/for obstructive sleep apnea.    Medication Adjustments/Labs and Tests Ordered: Current medicines are reviewed at length with the patient today.  Concerns regarding medicines are outlined above.  Orders Placed This Encounter  Procedures  . EKG 12-Lead   No orders of the defined types were placed in this encounter.   There are no Patient Instructions on file for this visit.   Signed, March 2021, MD  01/16/2020 9:46 AM    [facility]

## 2020-01-16 ENCOUNTER — Telehealth: Payer: Self-pay | Admitting: Radiology

## 2020-01-16 ENCOUNTER — Other Ambulatory Visit: Payer: Self-pay

## 2020-01-16 ENCOUNTER — Ambulatory Visit (INDEPENDENT_AMBULATORY_CARE_PROVIDER_SITE_OTHER): Payer: Medicare HMO | Admitting: Interventional Cardiology

## 2020-01-16 ENCOUNTER — Encounter: Payer: Self-pay | Admitting: Interventional Cardiology

## 2020-01-16 VITALS — BP 142/62 | HR 60 | Ht 73.0 in | Wt 220.0 lb

## 2020-01-16 DIAGNOSIS — E7849 Other hyperlipidemia: Secondary | ICD-10-CM | POA: Diagnosis not present

## 2020-01-16 DIAGNOSIS — I10 Essential (primary) hypertension: Secondary | ICD-10-CM

## 2020-01-16 DIAGNOSIS — Z7189 Other specified counseling: Secondary | ICD-10-CM | POA: Diagnosis not present

## 2020-01-16 DIAGNOSIS — I2510 Atherosclerotic heart disease of native coronary artery without angina pectoris: Secondary | ICD-10-CM

## 2020-01-16 DIAGNOSIS — I453 Trifascicular block: Secondary | ICD-10-CM | POA: Diagnosis not present

## 2020-01-16 DIAGNOSIS — G4733 Obstructive sleep apnea (adult) (pediatric): Secondary | ICD-10-CM | POA: Diagnosis not present

## 2020-01-16 DIAGNOSIS — Z953 Presence of xenogenic heart valve: Secondary | ICD-10-CM | POA: Diagnosis not present

## 2020-01-16 NOTE — Patient Instructions (Signed)
Medication Instructions:  Your physician recommends that you continue on your current medications as directed. Please refer to the Current Medication list given to you today.  *If you need a refill on your cardiac medications before your next appointment, please call your pharmacy*   Lab Work: None If you have labs (blood work) drawn today and your tests are completely normal, you will receive your results only by: Marland Kitchen MyChart Message (if you have MyChart) OR . A paper copy in the mail If you have any lab test that is abnormal or we need to change your treatment, we will call you to review the results.   Testing/Procedures: Your physician recommends that you wear a monitor for 3 days.  Make sure you are up moving around and active when wearing the monitor.    Follow-Up: At [facility], you and your health needs are our priority.  As part of our continuing mission to provide you with exceptional heart care, we have created designated Provider Care Teams.  These Care Teams include your primary Cardiologist (physician) and Advanced Practice Providers (APPs -  Physician Assistants and Nurse Practitioners) who all work together to provide you with the care you need, when you need it.  We recommend signing up for the patient portal called "MyChart".  Sign up information is provided on this After Visit Summary.  MyChart is used to connect with patients for Virtual Visits (Telemedicine).  Patients are able to view lab/test results, encounter notes, upcoming appointments, etc.  Non-urgent messages can be sent to your provider as well.   To learn more about what you can do with MyChart, go to [email or web address].    Your next appointment:   12 month(s)  The format for your next appointment:   In Person  Provider:   You may see Lesleigh Noe, MD or one of the following Advanced Practice Providers on your designated Care Team:    Norma Fredrickson, NP  Nada Boozer, NP  Georgie Chard,  NP    Other Instructions

## 2020-01-16 NOTE — Telephone Encounter (Signed)
Enrolled patient for a 3 day Zio XT monitor to be mailed to patients home  

## 2020-01-21 ENCOUNTER — Ambulatory Visit (INDEPENDENT_AMBULATORY_CARE_PROVIDER_SITE_OTHER): Payer: Medicare HMO

## 2020-01-21 DIAGNOSIS — I453 Trifascicular block: Secondary | ICD-10-CM

## 2020-02-03 DIAGNOSIS — I453 Trifascicular block: Secondary | ICD-10-CM | POA: Diagnosis not present

## 2020-02-20 DIAGNOSIS — I480 Paroxysmal atrial fibrillation: Secondary | ICD-10-CM | POA: Diagnosis not present

## 2020-02-20 DIAGNOSIS — F321 Major depressive disorder, single episode, moderate: Secondary | ICD-10-CM | POA: Diagnosis not present

## 2020-02-20 DIAGNOSIS — N401 Enlarged prostate with lower urinary tract symptoms: Secondary | ICD-10-CM | POA: Diagnosis not present

## 2020-02-20 DIAGNOSIS — E1142 Type 2 diabetes mellitus with diabetic polyneuropathy: Secondary | ICD-10-CM | POA: Diagnosis not present

## 2020-02-20 DIAGNOSIS — E785 Hyperlipidemia, unspecified: Secondary | ICD-10-CM | POA: Diagnosis not present

## 2020-02-20 DIAGNOSIS — E1140 Type 2 diabetes mellitus with diabetic neuropathy, unspecified: Secondary | ICD-10-CM | POA: Diagnosis not present

## 2020-02-20 DIAGNOSIS — I10 Essential (primary) hypertension: Secondary | ICD-10-CM | POA: Diagnosis not present

## 2020-02-20 DIAGNOSIS — F0390 Unspecified dementia without behavioral disturbance: Secondary | ICD-10-CM | POA: Diagnosis not present

## 2020-02-20 DIAGNOSIS — E782 Mixed hyperlipidemia: Secondary | ICD-10-CM | POA: Diagnosis not present

## 2020-02-21 ENCOUNTER — Other Ambulatory Visit: Payer: Self-pay | Admitting: Interventional Cardiology

## 2020-03-09 ENCOUNTER — Telehealth: Payer: Medicare HMO | Admitting: Cardiology

## 2020-03-13 DIAGNOSIS — I10 Essential (primary) hypertension: Secondary | ICD-10-CM | POA: Diagnosis not present

## 2020-03-13 DIAGNOSIS — E1140 Type 2 diabetes mellitus with diabetic neuropathy, unspecified: Secondary | ICD-10-CM | POA: Diagnosis not present

## 2020-03-13 DIAGNOSIS — E782 Mixed hyperlipidemia: Secondary | ICD-10-CM | POA: Diagnosis not present

## 2020-03-13 DIAGNOSIS — I480 Paroxysmal atrial fibrillation: Secondary | ICD-10-CM | POA: Diagnosis not present

## 2020-03-13 DIAGNOSIS — F0390 Unspecified dementia without behavioral disturbance: Secondary | ICD-10-CM | POA: Diagnosis not present

## 2020-03-13 DIAGNOSIS — E1142 Type 2 diabetes mellitus with diabetic polyneuropathy: Secondary | ICD-10-CM | POA: Diagnosis not present

## 2020-03-13 DIAGNOSIS — E785 Hyperlipidemia, unspecified: Secondary | ICD-10-CM | POA: Diagnosis not present

## 2020-03-13 DIAGNOSIS — F321 Major depressive disorder, single episode, moderate: Secondary | ICD-10-CM | POA: Diagnosis not present

## 2020-03-31 DIAGNOSIS — Z23 Encounter for immunization: Secondary | ICD-10-CM | POA: Diagnosis not present

## 2020-04-13 DIAGNOSIS — N403 Nodular prostate with lower urinary tract symptoms: Secondary | ICD-10-CM | POA: Diagnosis not present

## 2020-04-13 DIAGNOSIS — E1140 Type 2 diabetes mellitus with diabetic neuropathy, unspecified: Secondary | ICD-10-CM | POA: Diagnosis not present

## 2020-04-13 DIAGNOSIS — E1142 Type 2 diabetes mellitus with diabetic polyneuropathy: Secondary | ICD-10-CM | POA: Diagnosis not present

## 2020-04-13 DIAGNOSIS — E782 Mixed hyperlipidemia: Secondary | ICD-10-CM | POA: Diagnosis not present

## 2020-04-13 DIAGNOSIS — F0390 Unspecified dementia without behavioral disturbance: Secondary | ICD-10-CM | POA: Diagnosis not present

## 2020-04-13 DIAGNOSIS — I480 Paroxysmal atrial fibrillation: Secondary | ICD-10-CM | POA: Diagnosis not present

## 2020-04-13 DIAGNOSIS — I10 Essential (primary) hypertension: Secondary | ICD-10-CM | POA: Diagnosis not present

## 2020-04-13 DIAGNOSIS — E785 Hyperlipidemia, unspecified: Secondary | ICD-10-CM | POA: Diagnosis not present

## 2020-04-13 DIAGNOSIS — F321 Major depressive disorder, single episode, moderate: Secondary | ICD-10-CM | POA: Diagnosis not present

## 2020-04-15 ENCOUNTER — Encounter: Payer: Self-pay | Admitting: Cardiology

## 2020-04-15 ENCOUNTER — Ambulatory Visit (INDEPENDENT_AMBULATORY_CARE_PROVIDER_SITE_OTHER): Payer: Medicare HMO | Admitting: Cardiology

## 2020-04-15 ENCOUNTER — Other Ambulatory Visit: Payer: Self-pay

## 2020-04-15 VITALS — BP 124/58 | HR 78 | Ht 73.0 in | Wt 216.0 lb

## 2020-04-15 DIAGNOSIS — G4719 Other hypersomnia: Secondary | ICD-10-CM

## 2020-04-15 DIAGNOSIS — G4733 Obstructive sleep apnea (adult) (pediatric): Secondary | ICD-10-CM | POA: Diagnosis not present

## 2020-04-15 DIAGNOSIS — Z9989 Dependence on other enabling machines and devices: Secondary | ICD-10-CM | POA: Diagnosis not present

## 2020-04-15 DIAGNOSIS — I10 Essential (primary) hypertension: Secondary | ICD-10-CM

## 2020-04-15 NOTE — Progress Notes (Signed)
Date:  04/15/2020   ID:  Patrick Henson, DOB 1940-07-26, MRN [id number]  PCP:  Catha Gosselin, MD  Cardiologist:  Lesleigh Noe, MD  Sleep Medicine:  Armanda Magic, MD Electrophysiologist:  None   Chief Complaint:  OSA and HTN  History of Present Illness:    Patrick Henson is a 79 y.o. male who presents via audio/video conferencing for a telehealth visit today.    Patrick Henson is a 79 y.o. male with a hx of OSA on CPAP, HTN and obesity. He is doing well with his CPAP device and thinks that he has gotten used to it.  He tolerates the mask but it is leaking. He changed out the cushion a few months ago.  The mask is over 61 year old.   He feels the pressure is adequate.  He has been having problems with getting sleepy during the day.  He goes to bed at 10pm and falls asleep within 15 min.  He gets up very frequently at night to urinate but otherwise does not wake up.  Since going on CPAP he feels rested in the am but then feels sleepy later in the am which is concerning his wife.  He denies any significant mouth or nasal dryness or nasal congestion.  He does not think that he snores.    Prior CV studies:   The following studies were reviewed today:  PAP compliance download  Past Medical History:  Diagnosis Date  . Aortic stenosis    a. s/p bioprosthetic AVR at [facility] 2013.  . Arthritis   . BPPV (benign paroxysmal positional vertigo)   . CAD (coronary artery disease)    a. cath report 2013: "Heavy coronary calcification with up to 50% eccentric mid to proximal LAD, 50-70% ostial diagonal, 40% first obtuse marginal, and 50-60% proximal codominant RCA. I believe that all of the patient's coronary lesions are less than hemodynamically significant. "  . Diabetes mellitus   . Hyperlipidemia   . Hypertension   . Memory loss   . Mild aortic regurgitation   . Mild mitral regurgitation   . Obesity (BMI 30-39.9)   . Obstructive sleep apnea    Past Surgical History:  Procedure Laterality Date  .  AORTIC VALVE REPLACEMENT     per patient reports, performed at [facility] in 2013 and then had rehab  . APPENDECTOMY    . CATARACT EXTRACTION Bilateral   . FEMORAL HERNIA REPAIR    . left knee replacement     2006  . right hip placement     2007  . TONSILLECTOMY       Current Meds  Medication Sig  . amLODipine (NORVASC) 10 MG tablet TAKE 1 TABLET(10 MG) BY MOUTH DAILY  . amoxicillin (AMOXIL) 500 MG capsule Take 4 capsules by mouth as directed. Prior to dental appointment  . Ascorbic Acid (VITAMIN C PO) Take 2 tablets by mouth daily.  Marland Kitchen atorvastatin (LIPITOR) 20 MG tablet Take 20 mg by mouth daily.  . cholecalciferol (VITAMIN D3) 25 MCG (1000 UT) tablet Take 1,000 Units by mouth daily.  Marland Kitchen lisinopril (ZESTRIL) 20 MG tablet Take 20 mg by mouth daily.  . metFORMIN (GLUCOPHAGE) 1000 MG tablet Take 1,000 mg by mouth daily with breakfast.   . metoprolol succinate (TOPROL-XL) 25 MG 24 hr tablet Take 25 mg by mouth daily.  . sildenafil (VIAGRA) 100 MG tablet Take 100 mg by mouth daily as needed for erectile dysfunction.  . vitamin B-12 (CYANOCOBALAMIN)  1000 MCG tablet Take 1,000 mcg by mouth daily.     Allergies:   Actos [pioglitazone] and Tradjenta [linagliptin]   Social History   Tobacco Use  . Smoking status: Never Smoker  . Smokeless tobacco: Never Used  Vaping Use  . Vaping Use: Never used  Substance Use Topics  . Alcohol use: No  . Drug use: No     Family Hx: The patient's family history includes Cancer in his mother; Diabetes in his mother; Heart attack in his father and mother; Heart disease in his mother; Skin cancer in his sister.  ROS:   Please see the history of present illness.     All other systems reviewed and are negative.   Labs/Other Tests and Data Reviewed:    Recent Labs: No results found for requested labs within last 8760 hours.   Recent Lipid Panel No results found for: CHOL, TRIG, HDL, CHOLHDL, LDLCALC, LDLDIRECT  Wt Readings from Last 3 Encounters:    04/15/20 216 lb (98 kg)  01/16/20 220 lb (99.8 kg)  04/17/19 212 lb (96.2 kg)     Objective:    Vital Signs:  BP (!) 124/58   Pulse 78   Ht 6\' 1"  (1.854 m)   Wt 216 lb (98 kg)   SpO2 98%   BMI 28.50 kg/m    GEN: Well nourished, well developed in no acute distress HEENT: Normal NECK: No JVD; No carotid bruits LYMPHATICS: No lymphadenopathy CARDIAC:RRR, no murmurs, rubs, gallops RESPIRATORY:  Clear to auscultation without rales, wheezing or rhonchi  ABDOMEN: Soft, non-tender, non-distended MUSCULOSKELETAL:  No edema; No deformity  SKIN: Warm and dry NEUROLOGIC:  Alert and oriented x 3 PSYCHIATRIC:  Normal affect    ASSESSMENT & PLAN:    1.  OSA - The patient is tolerating PAP therapy well without any problems. The PAP download was reviewed today and showed an AHI of 4.5/hr on 10 cm H2O with 77% compliance in using more than 4 hours nightly.  The patient has been using and benefiting from PAP use and will continue to benefit from therapy.  -I will order a new CPAP mask and supplies  2.  HTN -BP remains controlled on exam today -continue amlodipine 10mg  daily, Lisinopril  20mg  daily and Toprol XL 25mg  daily.   3.  Excessive daytimes sleepiness -suspect this is due to frequent awakenings at night to urinate -his sleep apnea is very well controlled.  -recommend f/u with PCP  Medication Adjustments/Labs and Tests Ordered: Current medicines are reviewed at length with the patient today.  Concerns regarding medicines are outlined above.  Tests Ordered: No orders of the defined types were placed in this encounter.  Medication Changes: No orders of the defined types were placed in this encounter.   Disposition:  Follow up 1 year  Signed, , MD  04/15/2020 3:42 PM    [facility]

## 2020-04-15 NOTE — Patient Instructions (Signed)

## 2020-04-16 ENCOUNTER — Telehealth: Payer: Self-pay | Admitting: *Deleted

## 2020-04-16 DIAGNOSIS — G4733 Obstructive sleep apnea (adult) (pediatric): Secondary | ICD-10-CM

## 2020-04-16 NOTE — Telephone Encounter (Addendum)
Order placed to [facility] via community message to Change to auto CPAP from 4 to 15cm H2O and get a download in 4 weeks 

## 2020-04-16 NOTE — Telephone Encounter (Signed)
-----   Message from Theresia Majors, RN sent at 04/15/2020  3:46 PM EST ----- Dr. Mayford Knife would like for you to order a new mask for this patient.  Thanks!!

## 2020-04-30 DIAGNOSIS — R3121 Asymptomatic microscopic hematuria: Secondary | ICD-10-CM | POA: Diagnosis not present

## 2020-05-25 DIAGNOSIS — E782 Mixed hyperlipidemia: Secondary | ICD-10-CM | POA: Diagnosis not present

## 2020-05-25 DIAGNOSIS — F0390 Unspecified dementia without behavioral disturbance: Secondary | ICD-10-CM | POA: Diagnosis not present

## 2020-05-25 DIAGNOSIS — N401 Enlarged prostate with lower urinary tract symptoms: Secondary | ICD-10-CM | POA: Diagnosis not present

## 2020-05-25 DIAGNOSIS — F321 Major depressive disorder, single episode, moderate: Secondary | ICD-10-CM | POA: Diagnosis not present

## 2020-05-25 DIAGNOSIS — I480 Paroxysmal atrial fibrillation: Secondary | ICD-10-CM | POA: Diagnosis not present

## 2020-05-25 DIAGNOSIS — E1142 Type 2 diabetes mellitus with diabetic polyneuropathy: Secondary | ICD-10-CM | POA: Diagnosis not present

## 2020-05-25 DIAGNOSIS — E1140 Type 2 diabetes mellitus with diabetic neuropathy, unspecified: Secondary | ICD-10-CM | POA: Diagnosis not present

## 2020-05-25 DIAGNOSIS — I10 Essential (primary) hypertension: Secondary | ICD-10-CM | POA: Diagnosis not present

## 2020-06-24 DIAGNOSIS — I480 Paroxysmal atrial fibrillation: Secondary | ICD-10-CM | POA: Diagnosis not present

## 2020-06-24 DIAGNOSIS — F0390 Unspecified dementia without behavioral disturbance: Secondary | ICD-10-CM | POA: Diagnosis not present

## 2020-06-24 DIAGNOSIS — N401 Enlarged prostate with lower urinary tract symptoms: Secondary | ICD-10-CM | POA: Diagnosis not present

## 2020-06-24 DIAGNOSIS — I10 Essential (primary) hypertension: Secondary | ICD-10-CM | POA: Diagnosis not present

## 2020-06-24 DIAGNOSIS — F321 Major depressive disorder, single episode, moderate: Secondary | ICD-10-CM | POA: Diagnosis not present

## 2020-06-24 DIAGNOSIS — E782 Mixed hyperlipidemia: Secondary | ICD-10-CM | POA: Diagnosis not present

## 2020-06-24 DIAGNOSIS — E1140 Type 2 diabetes mellitus with diabetic neuropathy, unspecified: Secondary | ICD-10-CM | POA: Diagnosis not present

## 2020-06-24 DIAGNOSIS — E785 Hyperlipidemia, unspecified: Secondary | ICD-10-CM | POA: Diagnosis not present

## 2020-06-24 DIAGNOSIS — E1142 Type 2 diabetes mellitus with diabetic polyneuropathy: Secondary | ICD-10-CM | POA: Diagnosis not present

## 2020-07-17 DIAGNOSIS — E1142 Type 2 diabetes mellitus with diabetic polyneuropathy: Secondary | ICD-10-CM | POA: Diagnosis not present

## 2020-07-17 DIAGNOSIS — R198 Other specified symptoms and signs involving the digestive system and abdomen: Secondary | ICD-10-CM | POA: Diagnosis not present

## 2020-07-17 DIAGNOSIS — Z7984 Long term (current) use of oral hypoglycemic drugs: Secondary | ICD-10-CM | POA: Diagnosis not present

## 2020-07-17 DIAGNOSIS — E1165 Type 2 diabetes mellitus with hyperglycemia: Secondary | ICD-10-CM | POA: Diagnosis not present

## 2020-07-17 DIAGNOSIS — Z79899 Other long term (current) drug therapy: Secondary | ICD-10-CM | POA: Diagnosis not present

## 2020-07-23 DIAGNOSIS — N401 Enlarged prostate with lower urinary tract symptoms: Secondary | ICD-10-CM | POA: Diagnosis not present

## 2020-07-23 DIAGNOSIS — F321 Major depressive disorder, single episode, moderate: Secondary | ICD-10-CM | POA: Diagnosis not present

## 2020-07-23 DIAGNOSIS — E1140 Type 2 diabetes mellitus with diabetic neuropathy, unspecified: Secondary | ICD-10-CM | POA: Diagnosis not present

## 2020-07-23 DIAGNOSIS — I10 Essential (primary) hypertension: Secondary | ICD-10-CM | POA: Diagnosis not present

## 2020-07-23 DIAGNOSIS — E785 Hyperlipidemia, unspecified: Secondary | ICD-10-CM | POA: Diagnosis not present

## 2020-07-23 DIAGNOSIS — F0390 Unspecified dementia without behavioral disturbance: Secondary | ICD-10-CM | POA: Diagnosis not present

## 2020-07-23 DIAGNOSIS — I480 Paroxysmal atrial fibrillation: Secondary | ICD-10-CM | POA: Diagnosis not present

## 2020-07-23 DIAGNOSIS — E1142 Type 2 diabetes mellitus with diabetic polyneuropathy: Secondary | ICD-10-CM | POA: Diagnosis not present

## 2020-07-23 DIAGNOSIS — E782 Mixed hyperlipidemia: Secondary | ICD-10-CM | POA: Diagnosis not present

## 2020-07-30 ENCOUNTER — Telehealth: Payer: Self-pay | Admitting: Cardiology

## 2020-07-30 NOTE — Telephone Encounter (Signed)
Wife of patient called. Wife states the water tube broke off of the patient's CPAP machine so now the tube does not fit properly.  She wanted to know if there was a local store that she could go to get a replacement hose rather than ordering a whole new machine.   Wife also wanted to know how long the patient had been using this specific CPAP machine. She states that the patients insurance may replace the machine every 3-5 years.  Please call her on her cell phone. They will be out running errands

## 2020-07-30 NOTE — Telephone Encounter (Signed)
Reached out to patient and wife got them set up with [facility] so they can get his new mask. Pt was agreeable to treatment.

## 2020-07-31 DIAGNOSIS — G4733 Obstructive sleep apnea (adult) (pediatric): Secondary | ICD-10-CM | POA: Diagnosis not present

## 2020-08-07 DIAGNOSIS — G4733 Obstructive sleep apnea (adult) (pediatric): Secondary | ICD-10-CM | POA: Diagnosis not present

## 2020-08-12 DIAGNOSIS — F321 Major depressive disorder, single episode, moderate: Secondary | ICD-10-CM | POA: Diagnosis not present

## 2020-08-12 DIAGNOSIS — E1142 Type 2 diabetes mellitus with diabetic polyneuropathy: Secondary | ICD-10-CM | POA: Diagnosis not present

## 2020-08-12 DIAGNOSIS — E785 Hyperlipidemia, unspecified: Secondary | ICD-10-CM | POA: Diagnosis not present

## 2020-08-12 DIAGNOSIS — I10 Essential (primary) hypertension: Secondary | ICD-10-CM | POA: Diagnosis not present

## 2020-08-12 DIAGNOSIS — E1140 Type 2 diabetes mellitus with diabetic neuropathy, unspecified: Secondary | ICD-10-CM | POA: Diagnosis not present

## 2020-08-12 DIAGNOSIS — N401 Enlarged prostate with lower urinary tract symptoms: Secondary | ICD-10-CM | POA: Diagnosis not present

## 2020-08-12 DIAGNOSIS — I480 Paroxysmal atrial fibrillation: Secondary | ICD-10-CM | POA: Diagnosis not present

## 2020-08-12 DIAGNOSIS — F0390 Unspecified dementia without behavioral disturbance: Secondary | ICD-10-CM | POA: Diagnosis not present

## 2020-08-12 DIAGNOSIS — E782 Mixed hyperlipidemia: Secondary | ICD-10-CM | POA: Diagnosis not present

## 2020-08-13 DIAGNOSIS — N289 Disorder of kidney and ureter, unspecified: Secondary | ICD-10-CM | POA: Diagnosis not present

## 2020-08-31 DIAGNOSIS — H35371 Puckering of macula, right eye: Secondary | ICD-10-CM | POA: Diagnosis not present

## 2020-08-31 DIAGNOSIS — E119 Type 2 diabetes mellitus without complications: Secondary | ICD-10-CM | POA: Diagnosis not present

## 2020-08-31 DIAGNOSIS — H52203 Unspecified astigmatism, bilateral: Secondary | ICD-10-CM | POA: Diagnosis not present

## 2020-08-31 DIAGNOSIS — H524 Presbyopia: Secondary | ICD-10-CM | POA: Diagnosis not present

## 2020-09-04 ENCOUNTER — Telehealth: Payer: Self-pay | Admitting: Interventional Cardiology

## 2020-09-04 DIAGNOSIS — G4733 Obstructive sleep apnea (adult) (pediatric): Secondary | ICD-10-CM

## 2020-09-04 NOTE — Telephone Encounter (Signed)
Patient's wife called to say that the patient went from a nasal mask to a full mask and with the full mask patient feels as though he is suffocating with it. Please advise

## 2020-09-04 NOTE — Telephone Encounter (Signed)
Patient is having problems with his F30 I mask he can not tolerate it. It feels like he is suffocating. Please advise  Patrick Henson, Patrick Henson 08/04/2020 - 09/02/2020 Patient ID: [id number] DOB: 02/26/1941 Age: 80 years 30.5 [place] (Therapist) [address] [place], [address] Compliance Report Usage 08/04/2020 - 09/02/2020 Usage days 26/30 days (87%) >= 4 hours 5 days (17%) < 4 hours 21 days (70%) Usage hours 50 hours 22 minutes Average usage (total days) 1 hours 41 minutes Average usage (days used) 1 hours 56 minutes Median usage (days used) 1 hours 6 minutes Total used hours (value since last reset - 09/02/2020) 5,924 hours AirSense 10 CPAP Serial number [id number] Mode CPAP Set pressure 10 cmH2O EPR Fulltime EPR level 3 Therapy Leaks - L/min 95th percentile: 69.4 Events per hour AHI: 7.1 Usage - hours Printed on 09/04/2020 - [employer]Med AirView version 4.33.0-3.0 Page 1 of 1

## 2020-09-07 NOTE — Telephone Encounter (Signed)
Order placed to [facility] via community message to Change to auto CPAP from 4 to 15cm H2O and get a download in 4 weeks

## 2020-09-07 NOTE — Telephone Encounter (Signed)
Change to auto CPAP from 4 to 15cm H2O and get a download in 4 weeks

## 2020-09-07 NOTE — Telephone Encounter (Signed)
Pt's wife Corrie Dandy is returning call to [facility] in regards to her husband CPAP machine. Please Advise

## 2020-09-07 NOTE — Telephone Encounter (Signed)
Returned Call: Advised pts. wife that dr Mayford Knife has changed his pressure from a set pressure to an auto pressure of 4 to 15 cm.

## 2020-09-08 NOTE — Telephone Encounter (Signed)
Returned call: Called and left a message for Mrs Snowdon to say the order has been sent and they will get patients pressure adjusted asap.

## 2020-09-08 NOTE — Telephone Encounter (Signed)
Patient's wife is following up regarding CPAP machine. Per previous message, Dr. Mayford Knife changed pressure from 4 to 15 cm, however, patient's wife states the pressure still has not been adjusted. She is requesting to speak with [facility] to discuss.

## 2020-09-08 NOTE — Telephone Encounter (Signed)
Wife of patient called. She just missed Nina's call and would like [facility] to call her back

## 2020-09-18 NOTE — Telephone Encounter (Signed)
Fax came from [facility] stating they can not change patients pressure because patients machine only has the option for CPAP and NO Auto Pap. Pllease advise

## 2020-09-29 NOTE — Telephone Encounter (Signed)
lmtcb need to verify if patient has received his new mask in order to get a download.

## 2020-10-14 DIAGNOSIS — F321 Major depressive disorder, single episode, moderate: Secondary | ICD-10-CM | POA: Diagnosis not present

## 2020-10-14 DIAGNOSIS — N401 Enlarged prostate with lower urinary tract symptoms: Secondary | ICD-10-CM | POA: Diagnosis not present

## 2020-10-14 DIAGNOSIS — I10 Essential (primary) hypertension: Secondary | ICD-10-CM | POA: Diagnosis not present

## 2020-10-14 DIAGNOSIS — E1142 Type 2 diabetes mellitus with diabetic polyneuropathy: Secondary | ICD-10-CM | POA: Diagnosis not present

## 2020-10-14 DIAGNOSIS — E785 Hyperlipidemia, unspecified: Secondary | ICD-10-CM | POA: Diagnosis not present

## 2020-10-14 DIAGNOSIS — E1140 Type 2 diabetes mellitus with diabetic neuropathy, unspecified: Secondary | ICD-10-CM | POA: Diagnosis not present

## 2020-10-14 DIAGNOSIS — E782 Mixed hyperlipidemia: Secondary | ICD-10-CM | POA: Diagnosis not present

## 2020-10-14 DIAGNOSIS — I480 Paroxysmal atrial fibrillation: Secondary | ICD-10-CM | POA: Diagnosis not present

## 2020-10-14 DIAGNOSIS — F0390 Unspecified dementia without behavioral disturbance: Secondary | ICD-10-CM | POA: Diagnosis not present

## 2020-11-19 DIAGNOSIS — G4733 Obstructive sleep apnea (adult) (pediatric): Secondary | ICD-10-CM | POA: Diagnosis not present

## 2020-11-26 NOTE — Telephone Encounter (Addendum)
Called No VM set up.

## 2020-12-19 DIAGNOSIS — G4733 Obstructive sleep apnea (adult) (pediatric): Secondary | ICD-10-CM | POA: Diagnosis not present

## 2020-12-22 DIAGNOSIS — E1142 Type 2 diabetes mellitus with diabetic polyneuropathy: Secondary | ICD-10-CM | POA: Diagnosis not present

## 2020-12-22 DIAGNOSIS — F0390 Unspecified dementia without behavioral disturbance: Secondary | ICD-10-CM | POA: Diagnosis not present

## 2020-12-22 DIAGNOSIS — E1140 Type 2 diabetes mellitus with diabetic neuropathy, unspecified: Secondary | ICD-10-CM | POA: Diagnosis not present

## 2020-12-22 DIAGNOSIS — I10 Essential (primary) hypertension: Secondary | ICD-10-CM | POA: Diagnosis not present

## 2020-12-22 DIAGNOSIS — E782 Mixed hyperlipidemia: Secondary | ICD-10-CM | POA: Diagnosis not present

## 2020-12-22 DIAGNOSIS — E785 Hyperlipidemia, unspecified: Secondary | ICD-10-CM | POA: Diagnosis not present

## 2020-12-22 DIAGNOSIS — F321 Major depressive disorder, single episode, moderate: Secondary | ICD-10-CM | POA: Diagnosis not present

## 2020-12-22 DIAGNOSIS — N401 Enlarged prostate with lower urinary tract symptoms: Secondary | ICD-10-CM | POA: Diagnosis not present

## 2020-12-22 DIAGNOSIS — I480 Paroxysmal atrial fibrillation: Secondary | ICD-10-CM | POA: Diagnosis not present

## 2021-01-05 NOTE — Telephone Encounter (Signed)
Patients wife says he got two FF mask but he can not tolerate either one and has stopped using his cpap therapy. Reached out to [place] at [place] to order him new nasal mask since he is not tolerating his FF mask.  Wife says Patrick Henson is a mouth breather and the nasal mask will not work. Wife was encouraged to give that information to the the therapist when they call back from [facility] to get a proper fit for the patient.

## 2021-01-11 DIAGNOSIS — T63441A Toxic effect of venom of bees, accidental (unintentional), initial encounter: Secondary | ICD-10-CM | POA: Diagnosis not present

## 2021-01-15 DIAGNOSIS — Z0000 Encounter for general adult medical examination without abnormal findings: Secondary | ICD-10-CM | POA: Diagnosis not present

## 2021-01-15 DIAGNOSIS — Z1389 Encounter for screening for other disorder: Secondary | ICD-10-CM | POA: Diagnosis not present

## 2021-01-19 DIAGNOSIS — G4733 Obstructive sleep apnea (adult) (pediatric): Secondary | ICD-10-CM | POA: Diagnosis not present

## 2021-02-09 NOTE — Telephone Encounter (Signed)
PROCEDURE IS NOT AUTHORIZED - CASE REQUIRES CLINICAL REVIEW.waiting for the nurse reviewer to contact us.

## 2021-02-09 NOTE — Addendum Note (Signed)
Addended by: Reesa Chew on: 02/09/2021 11:22 AM   Modules accepted: Orders

## 2021-02-12 NOTE — Telephone Encounter (Signed)
Ok to schedule TITRATION valid dates are 02/26/21- 10/30-22 aut #[id number].

## 2021-02-15 DIAGNOSIS — E1140 Type 2 diabetes mellitus with diabetic neuropathy, unspecified: Secondary | ICD-10-CM | POA: Diagnosis not present

## 2021-02-15 DIAGNOSIS — F321 Major depressive disorder, single episode, moderate: Secondary | ICD-10-CM | POA: Diagnosis not present

## 2021-02-15 DIAGNOSIS — E782 Mixed hyperlipidemia: Secondary | ICD-10-CM | POA: Diagnosis not present

## 2021-02-15 DIAGNOSIS — I10 Essential (primary) hypertension: Secondary | ICD-10-CM | POA: Diagnosis not present

## 2021-02-15 DIAGNOSIS — E1142 Type 2 diabetes mellitus with diabetic polyneuropathy: Secondary | ICD-10-CM | POA: Diagnosis not present

## 2021-02-15 DIAGNOSIS — I480 Paroxysmal atrial fibrillation: Secondary | ICD-10-CM | POA: Diagnosis not present

## 2021-02-15 DIAGNOSIS — E785 Hyperlipidemia, unspecified: Secondary | ICD-10-CM | POA: Diagnosis not present

## 2021-02-15 DIAGNOSIS — F0390 Unspecified dementia without behavioral disturbance: Secondary | ICD-10-CM | POA: Diagnosis not present

## 2021-03-09 ENCOUNTER — Other Ambulatory Visit: Payer: Self-pay

## 2021-03-09 ENCOUNTER — Ambulatory Visit (HOSPITAL_BASED_OUTPATIENT_CLINIC_OR_DEPARTMENT_OTHER): Payer: Medicare HMO | Attending: Cardiology | Admitting: Cardiology

## 2021-03-09 DIAGNOSIS — Z538 Procedure and treatment not carried out for other reasons: Secondary | ICD-10-CM | POA: Insufficient documentation

## 2021-03-15 ENCOUNTER — Other Ambulatory Visit: Payer: Self-pay | Admitting: Interventional Cardiology

## 2021-03-15 ENCOUNTER — Other Ambulatory Visit: Payer: Self-pay

## 2021-03-15 MED ORDER — AMLODIPINE BESYLATE 10 MG PO TABS: 10.0000 mg | ORAL_TABLET | Freq: Every day | ORAL | 0 refills | Status: AC

## 2021-03-23 NOTE — Procedures (Signed)
Erroneous encounter

## 2021-03-23 NOTE — Progress Notes (Signed)
This encounter was created in error - please disregard.

## 2021-03-24 DIAGNOSIS — I10 Essential (primary) hypertension: Secondary | ICD-10-CM | POA: Diagnosis not present

## 2021-03-24 DIAGNOSIS — F0390 Unspecified dementia without behavioral disturbance: Secondary | ICD-10-CM | POA: Diagnosis not present

## 2021-03-24 DIAGNOSIS — R809 Proteinuria, unspecified: Secondary | ICD-10-CM | POA: Diagnosis not present

## 2021-03-24 DIAGNOSIS — E782 Mixed hyperlipidemia: Secondary | ICD-10-CM | POA: Diagnosis not present

## 2021-03-24 DIAGNOSIS — E1142 Type 2 diabetes mellitus with diabetic polyneuropathy: Secondary | ICD-10-CM | POA: Diagnosis not present

## 2021-03-26 DIAGNOSIS — R809 Proteinuria, unspecified: Secondary | ICD-10-CM | POA: Diagnosis not present

## 2021-03-26 DIAGNOSIS — E782 Mixed hyperlipidemia: Secondary | ICD-10-CM | POA: Diagnosis not present

## 2021-03-26 DIAGNOSIS — E1142 Type 2 diabetes mellitus with diabetic polyneuropathy: Secondary | ICD-10-CM | POA: Diagnosis not present

## 2021-03-26 DIAGNOSIS — I10 Essential (primary) hypertension: Secondary | ICD-10-CM | POA: Diagnosis not present

## 2021-04-02 ENCOUNTER — Telehealth: Payer: Self-pay | Admitting: *Deleted

## 2021-04-02 DIAGNOSIS — G4733 Obstructive sleep apnea (adult) (pediatric): Secondary | ICD-10-CM

## 2021-04-02 NOTE — Telephone Encounter (Signed)
-----   Message from Quintella Reichert, MD sent at 03/23/2021  2:20 PM EDT ----- Molli Knock so can you find out if he ended up getting another full facemask that he can use ----- Message ----- From: Reesa Chew, CMA Sent: 03/23/2021   1:09 PM EDT To: Quintella Reichert, MD  Ok.  From January 05 2021 Patients wife says he got two FF mask but he can not tolerate either one and has stopped using his cpap therapy. Reached out to [place] at [place] to order him new nasal mask since he is not tolerating his FF mask.  Wife says Mr Dingley is a mouth breather and the nasal mask will not work. Wife was encouraged to give that information to the the therapist when they call back from [facility] to get a proper fit for the patient.  January 05, 2021 Quintella Reichert, MD to Me    2:59 PM Please send back to sleep lab for CPAP titration with possible BiPAP    ----- Message ----- From: Quintella Reichert, MD Sent: 03/23/2021  12:23 PM EDT To: Reesa Chew, CMA  Needed this patient was sent over to the sleep lab for CPAP titration and I cannot find anything in epic stating to do that.  He did sleep the whole night and it was not completed.  The last correspondence I can find was from April when we changed him to auto CPAP and he was supposed to have a download in 4 weeks.  Please call the DME to find out if he was ever changed to auto CPAP and get a download please

## 2021-04-02 NOTE — Telephone Encounter (Signed)
Per the wife his machine is not 82 years old.

## 2021-04-02 NOTE — Telephone Encounter (Signed)
-----   Message from Quintella Reichert, MD sent at 03/23/2021  1:14 PM EDT ----- Then needs new device that does auto ----- Message ----- From: Reesa Chew, CMA Sent: 03/23/2021   1:13 PM EDT To: Quintella Reichert, MD  Order placed to [facility] via community message to Change to auto CPAP from 4 to 15cm H2O and get a download in 4 weeks  Fax came from [facility] stating they can not change patients pressure because patients machine only has the option for CPAP and NO Auto Pap.   ----- Message ----- From: Quintella Reichert, MD Sent: 03/23/2021  12:23 PM EDT To: Reesa Chew, CMA  Needed this patient was sent over to the sleep lab for CPAP titration and I cannot find anything in epic stating to do that.  He did sleep the whole night and it was not completed.  The last correspondence I can find was from April when we changed him to auto CPAP and he was supposed to have a download in 4 weeks.  Please call the DME to find out if he was ever changed to auto CPAP and get a download please

## 2021-04-02 NOTE — Telephone Encounter (Addendum)
Per the wife he did not get a new mask that fit  Per the wife his machine is not 82 years old.         You contacted Sherene Sires       Message from Quintella Reichert, MD sent at 03/23/2021  1:14 PM EDT ----- Then needs new device that does auto ----- Message ----- From: Reesa Chew, CMA Sent: 03/23/2021   1:13 PM EDT To: Quintella Reichert, MD   Order placed to [employer] [facility] via community message to Change to auto CPAP from 4 to 15cm H2O and get a download in 4 weeks   Fax came from [facility] stating they can not change patients pressure because patients machine only has the option for CPAP and NO Auto Pap.

## 2021-04-07 DIAGNOSIS — G4733 Obstructive sleep apnea (adult) (pediatric): Secondary | ICD-10-CM | POA: Diagnosis not present

## 2021-04-07 DIAGNOSIS — I10 Essential (primary) hypertension: Secondary | ICD-10-CM | POA: Diagnosis not present

## 2021-04-09 NOTE — Telephone Encounter (Signed)
Quintella Reichert, MD  Reesa Chew, CMA Did he go back to [facility] and look at any of the nasal masks with the hope of adding a chinstrap for mouth breathing?

## 2021-04-09 NOTE — Addendum Note (Signed)
Addended by: Reesa Chew on: 04/09/2021 04:11 PM   Modules accepted: Orders, Level of Service, SmartSet

## 2021-04-09 NOTE — Telephone Encounter (Signed)
Order placed for nasal mask and chin strap

## 2021-04-09 NOTE — Telephone Encounter (Signed)
This encounter was created in error - please disregard.

## 2021-04-09 NOTE — Addendum Note (Signed)
Addended by: Reesa Chew on: 04/09/2021 04:12 PM   Modules accepted: Orders

## 2021-04-13 ENCOUNTER — Encounter (HOSPITAL_BASED_OUTPATIENT_CLINIC_OR_DEPARTMENT_OTHER): Payer: Medicare HMO | Admitting: Cardiology

## 2021-04-26 ENCOUNTER — Telehealth: Payer: Self-pay | Admitting: *Deleted

## 2021-04-26 DIAGNOSIS — G4733 Obstructive sleep apnea (adult) (pediatric): Secondary | ICD-10-CM

## 2021-04-26 NOTE — Telephone Encounter (Signed)
Prior Authorization for TITRATION sent to [facility] via web portal. . Procedure Tracking #:[id number]. (PROCEDURE IS NOT AUTHORIZED - CASE REQUIRES CLINICAL REVIEW. already provided clinicals faxed to [phone].

## 2021-04-26 NOTE — Telephone Encounter (Signed)
-----   Message from Quintella Reichert, MD sent at 04/09/2021  4:07 PM EST ----- [place] the patient showed up for the sleep study but then he did not stay and so it was never completed he needs to have a CPAP titration ----- Message ----- From: Reesa Chew, CMA Sent: 04/09/2021   3:56 PM EST To: Quintella Reichert, MD  He just had a titration on 03/09/21. ----- Message ----- From: Quintella Reichert, MD Sent: 04/07/2021  11:00 AM EST To: Reesa Chew, CMA  Her if his device is not 5 years yet.  He has failed a set pressure setting on CPAP and needs to be on auto CPAP.  If they cannot switch him out to an auto CPAP device that he needs to go to the sleep lab for a CPAP titration ----- Message ----- From: Reesa Chew, CMA Sent: 04/02/2021  11:45 AM EST To: Quintella Reichert, MD  Patient has not had his machine for 5 years yet. ----- Message ----- From: Quintella Reichert, MD Sent: 03/23/2021   1:15 PM EDT To: Reesa Chew, CMA  Then needs new device that does auto ----- Message ----- From: Reesa Chew, CMA Sent: 03/23/2021   1:13 PM EDT To: Quintella Reichert, MD  Order placed to [employer] [facility] via community message to Change to auto CPAP from 4 to 15cm H2O and get a download in 4 weeks  Fax came from [employer] [facility] stating they can not change patients pressure because patients machine only has the option for CPAP and NO Auto Pap.   ----- Message ----- From: Quintella Reichert, MD Sent: 03/23/2021  12:23 PM EDT To: Reesa Chew, CMA  Needed this patient was sent over to the sleep lab for CPAP titration and I cannot find anything in epic stating to do that.  He did sleep the whole night and it was not completed.  The last correspondence I can find was from April when we changed him to auto CPAP and he was supposed to have a download in 4 weeks.  Please call the DME to find out if he was ever changed to auto CPAP and get a download please

## 2021-04-28 ENCOUNTER — Other Ambulatory Visit: Payer: Self-pay

## 2021-04-28 ENCOUNTER — Observation Stay (HOSPITAL_COMMUNITY)
Admission: EM | Admit: 2021-04-28 | Discharge: 2021-04-30 | Disposition: A | Discharge status: Home / Self Care | Payer: Medicare HMO | Attending: Internal Medicine | Admitting: Internal Medicine

## 2021-04-28 ENCOUNTER — Emergency Department (HOSPITAL_COMMUNITY): Payer: Medicare HMO

## 2021-04-28 DIAGNOSIS — D72819 Decreased white blood cell count, unspecified: Secondary | ICD-10-CM | POA: Diagnosis not present

## 2021-04-28 DIAGNOSIS — Z79899 Other long term (current) drug therapy: Secondary | ICD-10-CM | POA: Insufficient documentation

## 2021-04-28 DIAGNOSIS — I10 Essential (primary) hypertension: Secondary | ICD-10-CM | POA: Diagnosis not present

## 2021-04-28 DIAGNOSIS — N183 Chronic kidney disease, stage 3 unspecified: Secondary | ICD-10-CM | POA: Diagnosis not present

## 2021-04-28 DIAGNOSIS — R2681 Unsteadiness on feet: Secondary | ICD-10-CM | POA: Insufficient documentation

## 2021-04-28 DIAGNOSIS — D696 Thrombocytopenia, unspecified: Secondary | ICD-10-CM | POA: Diagnosis present

## 2021-04-28 DIAGNOSIS — G4733 Obstructive sleep apnea (adult) (pediatric): Secondary | ICD-10-CM | POA: Diagnosis present

## 2021-04-28 DIAGNOSIS — D649 Anemia, unspecified: Principal | ICD-10-CM

## 2021-04-28 DIAGNOSIS — I509 Heart failure, unspecified: Secondary | ICD-10-CM | POA: Diagnosis not present

## 2021-04-28 DIAGNOSIS — E119 Type 2 diabetes mellitus without complications: Secondary | ICD-10-CM | POA: Insufficient documentation

## 2021-04-28 DIAGNOSIS — R0602 Shortness of breath: Secondary | ICD-10-CM | POA: Diagnosis present

## 2021-04-28 DIAGNOSIS — I110 Hypertensive heart disease with heart failure: Secondary | ICD-10-CM | POA: Diagnosis not present

## 2021-04-28 DIAGNOSIS — Z20822 Contact with and (suspected) exposure to covid-19: Secondary | ICD-10-CM | POA: Diagnosis not present

## 2021-04-28 DIAGNOSIS — E8770 Fluid overload, unspecified: Secondary | ICD-10-CM | POA: Diagnosis not present

## 2021-04-28 DIAGNOSIS — I2510 Atherosclerotic heart disease of native coronary artery without angina pectoris: Secondary | ICD-10-CM | POA: Insufficient documentation

## 2021-04-28 DIAGNOSIS — Z952 Presence of prosthetic heart valve: Secondary | ICD-10-CM

## 2021-04-28 DIAGNOSIS — I214 Non-ST elevation (NSTEMI) myocardial infarction: Secondary | ICD-10-CM | POA: Diagnosis not present

## 2021-04-28 DIAGNOSIS — R0609 Other forms of dyspnea: Secondary | ICD-10-CM | POA: Diagnosis not present

## 2021-04-28 DIAGNOSIS — E118 Type 2 diabetes mellitus with unspecified complications: Secondary | ICD-10-CM

## 2021-04-28 DIAGNOSIS — M1990 Unspecified osteoarthritis, unspecified site: Secondary | ICD-10-CM | POA: Diagnosis present

## 2021-04-28 DIAGNOSIS — Z794 Long term (current) use of insulin: Secondary | ICD-10-CM

## 2021-04-28 DIAGNOSIS — E785 Hyperlipidemia, unspecified: Secondary | ICD-10-CM | POA: Diagnosis present

## 2021-04-28 DIAGNOSIS — L8990 Pressure ulcer of unspecified site, unspecified stage: Secondary | ICD-10-CM | POA: Insufficient documentation

## 2021-04-28 LAB — BASIC METABOLIC PANEL
Anion gap: 6 (ref 5–15)
BUN: 20 mg/dL (ref 8–23)
CO2: 23 mmol/L (ref 22–32)
Calcium: 8.5 mg/dL — ABNORMAL LOW (ref 8.9–10.3)
Chloride: 105 mmol/L (ref 98–111)
Creatinine, Ser: 1.25 mg/dL — ABNORMAL HIGH (ref 0.61–1.24)
GFR, Estimated: 57 mL/min — ABNORMAL LOW (ref >60)
Glucose, Bld: 147 mg/dL — ABNORMAL HIGH (ref 70–99)
Potassium: 4.5 mmol/L (ref 3.5–5.1)
Sodium: 134 mmol/L — ABNORMAL LOW (ref 135–145)

## 2021-04-28 LAB — CBC
HCT: 24 % — ABNORMAL LOW (ref 39.0–52.0)
Hemoglobin: 7.6 g/dL — ABNORMAL LOW (ref 13.0–17.0)
MCH: 26.2 pg (ref 26.0–34.0)
MCHC: 31.7 g/dL (ref 30.0–36.0)
MCV: 82.8 fL (ref 80.0–100.0)
Platelets: 147 10*3/uL — ABNORMAL LOW (ref 150–400)
RBC: 2.9 MIL/uL — ABNORMAL LOW (ref 4.22–5.81)
RDW: 13.6 % (ref 11.5–15.5)
WBC: 3.9 10*3/uL — ABNORMAL LOW (ref 4.0–10.5)
nRBC: 0 % (ref 0.0–0.2)

## 2021-04-28 LAB — POC OCCULT BLOOD, ED: Fecal Occult Bld: NEGATIVE

## 2021-04-28 LAB — RESP PANEL BY RT-PCR (FLU A&B, COVID) ARPGX2
Influenza A by PCR: NEGATIVE
Influenza B by PCR: NEGATIVE
SARS Coronavirus 2 by RT PCR: NEGATIVE

## 2021-04-28 LAB — TROPONIN I (HIGH SENSITIVITY)
Troponin I (High Sensitivity): 370 ng/L
Troponin I (High Sensitivity): 431 ng/L

## 2021-04-28 LAB — PREPARE RBC (CROSSMATCH)

## 2021-04-28 LAB — BRAIN NATRIURETIC PEPTIDE: B Natriuretic Peptide: 571.6 pg/mL — ABNORMAL HIGH (ref 0.0–100.0)

## 2021-04-28 MED ORDER — ATORVASTATIN CALCIUM 10 MG PO TABS
20.0000 mg | ORAL_TABLET | Freq: Every day | ORAL | Status: DC
  Administered 2021-04-29 – 2021-04-30 (×2): 20 mg via ORAL
  Filled 2021-04-28 (×2): qty 2

## 2021-04-28 MED ORDER — ALBUTEROL SULFATE HFA 108 (90 BASE) MCG/ACT IN AERS
2.0000 | INHALATION_SPRAY | RESPIRATORY_TRACT | Status: DC | PRN
  Filled 2021-04-28: qty 6.7

## 2021-04-28 MED ORDER — HYDRALAZINE HCL 25 MG PO TABS: 25.0000 mg | ORAL_TABLET | Freq: Four times a day (QID) | ORAL | Status: DC | PRN

## 2021-04-28 MED ORDER — ONDANSETRON HCL 4 MG PO TABS: 4.0000 mg | ORAL_TABLET | Freq: Four times a day (QID) | ORAL | Status: DC | PRN

## 2021-04-28 MED ORDER — POLYETHYLENE GLYCOL 3350 17 G PO PACK
17.0000 g | PACK | Freq: Two times a day (BID) | ORAL | Status: DC
  Administered 2021-04-29 (×3): 17 g via ORAL
  Filled 2021-04-28 (×4): qty 1

## 2021-04-28 MED ORDER — ONDANSETRON HCL 4 MG/2ML IJ SOLN: 4.0000 mg | Freq: Four times a day (QID) | INTRAMUSCULAR | Status: DC | PRN

## 2021-04-28 MED ORDER — VITAMIN B-12 1000 MCG PO TABS
1000.0000 ug | ORAL_TABLET | Freq: Every day | ORAL | Status: DC
  Administered 2021-04-28 – 2021-04-30 (×3): 1000 ug via ORAL
  Filled 2021-04-28 (×3): qty 1

## 2021-04-28 MED ORDER — METOPROLOL SUCCINATE ER 25 MG PO TB24
25.0000 mg | ORAL_TABLET | Freq: Every day | ORAL | Status: DC
  Administered 2021-04-29: 25 mg via ORAL
  Filled 2021-04-28 (×2): qty 1

## 2021-04-28 MED ORDER — FUROSEMIDE 10 MG/ML IJ SOLN
40.0000 mg | Freq: Once | INTRAMUSCULAR | Status: AC
  Administered 2021-04-28: 40 mg via INTRAVENOUS
  Filled 2021-04-28: qty 4

## 2021-04-28 MED ORDER — SODIUM CHLORIDE 0.9% IV SOLUTION: Freq: Once | INTRAVENOUS | Status: AC

## 2021-04-28 MED ORDER — PANTOPRAZOLE SODIUM 40 MG PO TBEC
40.0000 mg | DELAYED_RELEASE_TABLET | Freq: Two times a day (BID) | ORAL | Status: DC
  Administered 2021-04-28 – 2021-04-30 (×4): 40 mg via ORAL
  Filled 2021-04-28 (×4): qty 1

## 2021-04-28 NOTE — ED Triage Notes (Signed)
Pt POV, with c/o of increase shob x 2 weeks and CP two days ago. Pt currently denies c/p but has increase fatigue as well.      PMH: valve replacement, HTN, DM

## 2021-04-28 NOTE — Consult Note (Addendum)
Cardiology Consultation:   Patient ID: FAMOUS SPELLER MRN: [id number]; DOB: 1940-06-23  Admit date: 04/28/2021 Date of Consult: 04/28/2021  PCP:  Hulan Fess, MD   [facility] Providers Cardiologist:  Sinclair Grooms, MD  Sleep Medicine:  Fransico Him, MD     Patient Profile:   Patrick Henson is a 80 y.o. male with a hx of past medical history of OSA on CPAP, aortic stenosis status post bioprosthetic replacement '13 ([facility]), CAD hyperlipidemia, hypertension, sinus bradycardia/fascicular block, mildly dilated ascending aorta who is being seen 04/28/2021 for the evaluation of dyspnea on exertion at the request of Dr Reather Converse.  History of Present Illness:   Patrick Henson is an 80 year old male with past medical history noted above.  He has been followed by Dr. Tamala Julian as an outpatient, as well as Dr. Radford Pax for sleep apnea.  Had a cardiac cath in 2013 preoperatively showing "heavy coronary artery calcification with up to 50% eccentric mid to proximal LAD, 50 to 70% ostial diagonal, 40% first OM, and 50% proximal codominant RCA".  Recommendations for medical management.  Echo 07/2016 with LVEF of 60 to [id number], grade 2 diastolic dysfunction, AV bioprosthesis present and functioning normally with mildly elevated aortic valve gradient, mild AI, mild dilated ascending aorta, mild MR, mild TR.  He was last seen in the office with Dr. Tamala Julian on 12/2019 with wife present at appointment.  There was concern he was sleeping more often, napping in his chair.  Otherwise denied any chest pain, palpitations, dizziness or lightheadedness.  Did note bilateral lower extremity edema.  It was recommended that he follow-up with Dr. Radford Pax to determine if further adjustments need to be made with his CPAP as well as outpatient monitor to determine if he was experiencing any high degree AV block.  Saw Dr. Radford Pax 03/2020 with notes reporting his sleep apnea was well controlled with recommendations for follow-up with PCP as he was  awakening frequently during the night to urinate.  Presented to the ED 11/30 with complaints of increased shortness of breath for the past 2 weeks with brief intermittent episodes of chest discomfort.  He reports extreme fatigue, orthopnea, and PND.  Denies any lightheadedness, dizziness, nausea or vomiting.  Does report being significantly constipated and going 5 to 6 days in between bowel movements.  Has not noticed any frank blood or dark tarry stools.  Labs on admission showed sodium 134, potassium 4.5, creatinine 1.25, BNP 571, high-sensitivity troponin 370, WBC 3.9, hemoglobin 7.6, platelets 147K.  EKG showed Sinus rhythm, 69 bpm, bifascicular block, first-degree AV block.  Chest x-ray with small bilateral pleural effusions.  Internal medicine to admit.  Cardiology has been consulted in regards to his CHF and elevated troponin.    Past Medical History:  Diagnosis Date   Aortic stenosis    a. s/p bioprosthetic AVR at [facility] 2013.   Arthritis    BPPV (benign paroxysmal positional vertigo)    CAD (coronary artery disease)    a. cath report 2013: "Heavy coronary calcification with up to 50% eccentric mid to proximal LAD, 50-70% ostial diagonal, 40% first obtuse marginal, and 50-60% proximal codominant RCA. I believe that all of the patient's coronary lesions are less than hemodynamically significant. "   Diabetes mellitus    Hyperlipidemia    Hypertension    Memory loss    Mild aortic regurgitation    Mild mitral regurgitation    Obesity (BMI 30-39.9)    Obstructive sleep apnea     Past  Surgical History:  Procedure Laterality Date   AORTIC VALVE REPLACEMENT     per patient reports, performed at [facility] in 2013 and then had rehab   APPENDECTOMY     CATARACT EXTRACTION Bilateral    FEMORAL HERNIA REPAIR     left knee replacement     2006   right hip placement     2007   TONSILLECTOMY       Home Medications:  Prior to Admission medications   Medication Sig Start Date End Date  Taking? Authorizing Provider  amLODipine (NORVASC) 10 MG tablet Take 1 tablet (10 mg total) by mouth daily. Please keep upcoming appt. In Nov. In order to receive future refills. Patient taking differently: Take 5 mg by mouth daily. 03/15/21  Yes Belva Crome, MD  amoxicillin (AMOXIL) 500 MG capsule Take 4 capsules by mouth as directed. Prior to dental appointment 07/17/18  Yes [provider]  Ascorbic Acid (VITAMIN C PO) Take 2 tablets by mouth daily.   Yes [provider]  atorvastatin (LIPITOR) 20 MG tablet Take 20 mg by mouth daily.   Yes [provider]  cholecalciferol (VITAMIN D3) 25 MCG (1000 UT) tablet Take 2,000 Units by mouth daily.   Yes [provider]  lisinopril (ZESTRIL) 20 MG tablet Take 20 mg by mouth daily. 11/07/19  Yes [provider]  metoprolol succinate (TOPROL-XL) 25 MG 24 hr tablet Take 25 mg by mouth daily.   Yes [provider]  vitamin B-12 (CYANOCOBALAMIN) 1000 MCG tablet Take 1,000 mcg by mouth daily.   Yes [provider]    Inpatient Medications: Scheduled Meds:  sodium chloride   Intravenous Once   Continuous Infusions:  PRN Meds: albuterol  Allergies:    Allergies  Allergen Reactions   Actos [Pioglitazone] Swelling   Dulaglutide Other (See Comments)    Diarrhea   Tradjenta [Linagliptin]     Pt unsure of his reaction    Social History:   Social History   Socioeconomic History   Marital status: Married    Spouse name: Not on file   Number of children: 2   Years of education: Not on file   Highest education level: Master's degree (e.g., MA, MS, MEng, MEd, MSW, MBA)  Occupational History   Not on file  Tobacco Use   Smoking status: Never   Smokeless tobacco: Never  Vaping Use   Vaping Use: Never used  Substance and Sexual Activity   Alcohol use: No   Drug use: No   Sexual activity: Not on file  Other Topics Concern   Not on file  Social History Narrative   Lives at home  with his wife   Right handed   Caffeine: 1-2 cokes daily   Social Determinants of Health   Financial Resource Strain: Not on file  Food Insecurity: Not on file  Transportation Needs: Not on file  Physical Activity: Not on file  Stress: Not on file  Social Connections: Not on file  Intimate Partner Violence: Not on file    Family History:    Family History  Problem Relation Age of Onset   Heart attack Mother    Heart disease Mother    Diabetes Mother    Cancer Mother    Heart attack Father    Skin cancer Sister      ROS:  Please see the history of present illness.   All other ROS reviewed and negative.     Physical Exam/Data:  Vitals:   04/28/21 1056 04/28/21 1307 04/28/21 1449  BP: 119/64 108/71 120/65  Pulse: 71 66 (!) 57  Resp: 17 16 16   Temp: 97.7 F (36.5 C)    SpO2: 100% 100% 98%   No intake or output data in the 24 hours ending 04/28/21 1520 Last 3 Weights 03/09/2021 04/15/2020 01/16/2020  Weight (lbs) 205 lb 216 lb 220 lb  Weight (kg) 92.987 kg 97.977 kg 99.791 kg     There is no height or weight on file to calculate BMI.  General: Pale appearing older white male HEENT: normal Neck: no JVD Vascular: No carotid bruits; Distal pulses 2+ bilaterally Cardiac:  normal S1, S2; RRR; soft systolic murmur  Lungs:  clear to auscultation bilaterally, no wheezing, rhonchi or rales  Abd: soft, nontender, no hepatomegaly  Ext: no edema Musculoskeletal:  No deformities, BUE and BLE strength normal and equal Skin: warm and dry  Neuro:  CNs 2-12 intact, no focal abnormalities noted Psych:  Normal affect   EKG:  The EKG was personally reviewed and demonstrates: Sinus rhythm, 69 bpm, bifascicular block, first-degree AV block  Relevant CV Studies:  Echo: 12/2019  IMPRESSIONS     1. Left ventricular ejection fraction, by estimation, is 65 to 70%. The  left ventricle has normal function. The left ventricle has no regional  wall motion abnormalities. Left  ventricular diastolic parameters are  consistent with Grade I diastolic  dysfunction (impaired relaxation).   2. Right ventricular systolic function is normal. The right ventricular  size is normal. There is normal pulmonary artery systolic pressure. The  estimated right ventricular systolic pressure is 0000000 mmHg.   3. Left atrial size was mildly dilated.   4. The mitral valve is abnormal. Trivial mitral valve regurgitation.   5. The aortic valve has been repaired/replaced. Aortic valve  regurgitation is trivial. There is a bioprosthetic valve present in the  aortic position. Procedure Date: 2013. Aortic valve mean gradient measures  15.8 mmHg. Aortic valve Vmax measures 2.65  m/s.   6. Aortic dilatation noted. There is mild dilatation of the ascending  aorta measuring 39 mm.   7. The inferior vena cava is normal in size with greater than 50%  respiratory variability, suggesting right atrial pressure of 3 mmHg.   Comparison(s): Changes from prior study are noted. 08/19/2016: LVEF 60-65%,  bioprosthetic AVR, 26 mmHg mean gradient, mild AI.   FINDINGS   Left Ventricle: Left ventricular ejection fraction, by estimation, is 65  to 70%. The left ventricle has normal function. The left ventricle has no  regional wall motion abnormalities. The left ventricular internal cavity  size was normal in size. There is   no left ventricular hypertrophy. Left ventricular diastolic parameters  are consistent with Grade I diastolic dysfunction (impaired relaxation).  Indeterminate filling pressures.   Right Ventricle: The right ventricular size is normal. No increase in  right ventricular wall thickness. Right ventricular systolic function is  normal. There is normal pulmonary artery systolic pressure. The tricuspid  regurgitant velocity is 2.49 m/s, and   with an assumed right atrial pressure of 3 mmHg, the estimated right  ventricular systolic pressure is 0000000 mmHg.   Left Atrium: Left atrial size  was mildly dilated.   Right Atrium: Right atrial size was normal in size.   Pericardium: There is no evidence of pericardial effusion.   Mitral Valve: The mitral valve is abnormal. There is mild calcification of  the posterior mitral valve leaflet(s). Mild to moderate mitral annular  calcification. Trivial mitral valve regurgitation. MV peak gradient, 8.4  mmHg. The mean mitral valve  gradient is 4.0 mmHg.   Tricuspid Valve: The tricuspid valve is grossly normal. Tricuspid valve  regurgitation is trivial.   Aortic Valve: The aortic valve has been repaired/replaced. Aortic valve  regurgitation is trivial. Aortic valve mean gradient measures 15.8 mmHg.  Aortic valve peak gradient measures 28.1 mmHg. Aortic valve area, by VTI  measures 1.26 cm. There is a  bioprosthetic valve present in the aortic position. Procedure Date: 2013.   Pulmonic Valve: The pulmonic valve was normal in structure. Pulmonic valve  regurgitation is not visualized.   Aorta: Aortic dilatation noted. There is mild dilatation of the ascending  aorta measuring 39 mm.   Venous: The inferior vena cava is normal in size with greater than 50%  respiratory variability, suggesting right atrial pressure of 3 mmHg.   IAS/Shunts: No atrial level shunt detected by color flow Doppler.   Laboratory Data:  High Sensitivity Troponin:   Recent Labs  Lab 04/28/21 1106  TROPONINIHS 370*     Chemistry Recent Labs  Lab 04/28/21 1106  NA 134*  K 4.5  CL 105  CO2 23  GLUCOSE 147*  BUN 20  CREATININE 1.25*  CALCIUM 8.5*  GFRNONAA 57*  ANIONGAP 6    No results for input(s): PROT, ALBUMIN, AST, ALT, ALKPHOS, BILITOT in the last 168 hours. Lipids No results for input(s): CHOL, TRIG, HDL, LABVLDL, LDLCALC, CHOLHDL in the last 168 hours.  Hematology Recent Labs  Lab 04/28/21 1106  WBC 3.9*  RBC 2.90*  HGB 7.6*  HCT 24.0*  MCV 82.8  MCH 26.2  MCHC 31.7  RDW 13.6  PLT 147*   Thyroid No results for input(s):  TSH, FREET4 in the last 168 hours.  BNP Recent Labs  Lab 04/28/21 1106  BNP 571.6*    DDimer No results for input(s): DDIMER in the last 168 hours.   Radiology/Studies:  DG Chest 2 View  Result Date: 04/28/2021 CLINICAL DATA:  Shortness of breath EXAM: CHEST - 2 VIEW COMPARISON:  07/18/2011 FINDINGS: Previous sternotomy and aortic valve replacement. Heart size upper limits of normal. Atherosclerosis and tortuosity of the thoracic aorta. Bilateral pleural effusions with dependent atelectasis. Upper lungs are clear. No acute bone finding. IMPRESSION: Previous aortic valve replacement. Borderline cardiomegaly and aortic atherosclerosis. Bilateral small pleural effusions with dependent pulmonary atelectasis. Electronically Signed   By: Nelson Chimes M.D.   On: 04/28/2021 12:03     Assessment and Plan:   Patrick Henson is a 80 y.o. male with a hx of past medical history of OSA on CPAP, aortic stenosis status post replacement '13 ([facility]), hyperlipidemia, hypertension, sinus bradycardia/fascicular block who is being seen 04/28/2021 for the evaluation of dyspnea on exertion at the request of Dr Reather Converse.  Elevated troponin: Reports brief intermittent episodes of some chest discomfort over the past 2 weeks.  Symptoms have mostly been exertional shortness of breath.  Prior cardiac catheterization 2013 with mild to moderate nonobstructive CAD. High-sensitivity troponin noted at 370> 431.  EKG without acute ischemic changes.  This is in the setting of anemia with hemoglobin of 7.6 as well as CHF exacerbation, suspect demand ischemia --Cycle troponin --Check echo  Anemia/pancytopenia: Prior hemoglobin 12 now down to 7.6 on admission.  Denies any frank blood or dark tarry stools PTA.  FOBT negative x1 in the ED. Reports having colonoscopy around 5 years ago which was part of a routine study with no acute findings --  Ordered for 1 unit PRBCs --PPI  --Management per primary  HFpEF: Echo 12/2019 with EF of 65 to  70%, grade 1 diastolic dysfunction, normal RV, stable bioprosthetic aortic valve.  BNP 571, chest x-ray with bilateral pleural effusions. -- will dose with IV lasix 40mg  x1  -- check echo   CAD: Prior cath 2013 with mild to moderate diffuse disease noted above.  Hypertension: Blood pressures somewhat soft while in the ED.  Agree with holding home BP medications  AS status post aortic valve replacement: Bioprosthetic done at [facility] in 2013   Risk Assessment/Risk Scores:    New York Heart Association (NYHA) Functional Class NYHA Class II    For questions or updates, please contact [facility] Please consult [email or web address] for contact info under    Signed, 2014, NP  04/28/2021 3:20 PM   I have examined the patient and reviewed assessment and plan and discussed with patient.  Agree with above as stated.    Symptoms are secondary to his anemia.  UNclear source of anemia.  THis will need to be investigated.  Not on anticoagulation, no overt blood  in stool or melena.  No NSAID use.   Can treat for mild volume overload.  Valve seems to be functioning well by exam.  Check echo. Mildly elevated troponin likely from demand ischemia rather than true ACS.   Will follow.   04/30/2021

## 2021-04-28 NOTE — ED Provider Notes (Signed)
[facility] Provider Note   CSN: [id number] Arrival date & time: 04/28/21  1039     History Chief Complaint  Patient presents with   Shortness of Breath    Patrick Henson is a 80 y.o. male.  Patient with history of CAD, aortic stenosis prosthetic repair 2013 at [facility], diabetes, high blood pressure, memory loss, sleep apnea not currently tolerating CPAP at home, paroxysmal A. fib presents with worsening and persistent exertional shortness of breath for the past week.  Patient denies any fevers or productive cough.  Patient has had general fatigue and weakness nonfocal.  Patient denies any melena or gross blood in the stools.  No new medications.  No urinary symptoms.  Mild confusion however he has mild memory loss recently.  Patient had chest pain approximately 1 week ago burning sensation that went away.      Past Medical History:  Diagnosis Date   Aortic stenosis    a. s/p bioprosthetic AVR at [facility] 2013.   Arthritis    BPPV (benign paroxysmal positional vertigo)    CAD (coronary artery disease)    a. cath report 2013: "Heavy coronary calcification with up to 50% eccentric mid to proximal LAD, 50-70% ostial diagonal, 40% first obtuse marginal, and 50-60% proximal codominant RCA. I believe that all of the patient's coronary lesions are less than hemodynamically significant. "   Diabetes mellitus    Hyperlipidemia    Hypertension    Memory loss    Mild aortic regurgitation    Mild mitral regurgitation    Obesity (BMI 30-39.9)    Obstructive sleep apnea     Patient Active Problem List   Diagnosis Date Noted   Anemia 04/28/2021   History of aortic valve replacement with bioprosthetic valve 12/31/2013   Right bundle branch block 12/31/2013   Paroxysmal atrial fibrillation ([place]) 12/31/2013   Arthritis 07/25/2011   Type 2 diabetes mellitus with complication, with long-term current use of insulin ([place])    Hypertension    Hyperlipidemia     Obstructive sleep apnea     Past Surgical History:  Procedure Laterality Date   AORTIC VALVE REPLACEMENT     per patient reports, performed at [facility] in 2013 and then had rehab   APPENDECTOMY     CATARACT EXTRACTION Bilateral    [place]     left knee replacement     2006   right hip placement     2007   TONSILLECTOMY         Family History  Problem Relation Age of Onset   Heart attack Mother    Heart disease Mother    Diabetes Mother    Cancer Mother    Heart attack Father    Skin cancer Sister     Social History   Tobacco Use   Smoking status: Never   Smokeless tobacco: Never  Vaping Use   Vaping Use: Never used  Substance Use Topics   Alcohol use: No   Drug use: No    Home Medications Prior to Admission medications   Medication Sig Start Date End Date Taking? Authorizing Provider  amLODipine (NORVASC) 10 MG tablet Take 1 tablet (10 mg total) by mouth daily. Please keep upcoming appt. In Nov. In order to receive future refills. Patient taking differently: Take 5 mg by mouth daily. 03/15/21  Yes Belva Crome, MD  amoxicillin (AMOXIL) 500 MG capsule Take 4 capsules by mouth as directed. Prior to dental appointment  07/17/18  Yes [provider]  Ascorbic Acid (VITAMIN C PO) Take 2 tablets by mouth daily.   Yes [provider]  atorvastatin (LIPITOR) 20 MG tablet Take 20 mg by mouth daily.   Yes [provider]  cholecalciferol (VITAMIN D3) 25 MCG (1000 UT) tablet Take 2,000 Units by mouth daily.   Yes [provider]  lisinopril (ZESTRIL) 20 MG tablet Take 20 mg by mouth daily. 11/07/19  Yes [provider]  metoprolol succinate (TOPROL-XL) 25 MG 24 hr tablet Take 25 mg by mouth daily.   Yes [provider]  vitamin B-12 (CYANOCOBALAMIN) 1000 MCG tablet Take 1,000 mcg by mouth daily.   Yes [provider]    Allergies    Actos [pioglitazone], Dulaglutide, and Tradjenta  [linagliptin]  Review of Systems   Review of Systems  Constitutional:  Positive for fatigue. Negative for chills and fever.  HENT:  Negative for congestion.   Eyes:  Negative for visual disturbance.  Respiratory:  Positive for shortness of breath.   Cardiovascular:  Negative for chest pain.  Gastrointestinal:  Negative for abdominal pain and vomiting.  Genitourinary:  Negative for dysuria and flank pain.  Musculoskeletal:  Negative for back pain, neck pain and neck stiffness.  Skin:  Negative for rash.  Neurological:  Positive for weakness. Negative for light-headedness and headaches.   Physical Exam Updated Vital Signs BP 120/65   Pulse (!) 57   Temp 97.7 F (36.5 C)   Resp 16   SpO2 98%   Physical Exam Vitals and nursing note reviewed.  Constitutional:      General: He is not in acute distress.    Appearance: He is well-developed.  HENT:     Head: Normocephalic and atraumatic.     Mouth/Throat:     Mouth: Mucous membranes are moist.  Eyes:     General:        Right eye: No discharge.        Left eye: No discharge.     Conjunctiva/sclera: Conjunctivae normal.  Neck:     Trachea: No tracheal deviation.  Cardiovascular:     Rate and Rhythm: Normal rate and regular rhythm.  Pulmonary:     Effort: Pulmonary effort is normal.     Breath sounds: Normal breath sounds.  Abdominal:     General: There is no distension.     Palpations: Abdomen is soft.     Tenderness: There is no abdominal tenderness. There is no guarding.  Musculoskeletal:     Cervical back: Normal range of motion and neck supple. No rigidity.     Right lower leg: Edema present.     Left lower leg: Edema present.  Skin:    General: Skin is warm.     Capillary Refill: Capillary refill takes 2 to 3 seconds.     Coloration: Skin is pale.     Findings: No rash.  Neurological:     General: No focal deficit present.     Mental Status: He is alert.     Cranial Nerves: No cranial nerve deficit.   Psychiatric:        Mood and Affect: Mood normal.    ED Results / Procedures / Treatments   Labs (all labs ordered are listed, but only abnormal results are displayed) Labs Reviewed  BASIC METABOLIC PANEL - Abnormal; Notable for the following components:      Result Value   Sodium 134 (*)    Glucose, Bld 147 (*)  Creatinine, Ser 1.25 (*)    Calcium 8.5 (*)    GFR, Estimated 57 (*)    All other components within normal limits  CBC - Abnormal; Notable for the following components:   WBC 3.9 (*)    RBC 2.90 (*)    Hemoglobin 7.6 (*)    HCT 24.0 (*)    Platelets 147 (*)    All other components within normal limits  BRAIN NATRIURETIC PEPTIDE - Abnormal; Notable for the following components:   B Natriuretic Peptide 571.6 (*)    All other components within normal limits  TROPONIN I (HIGH SENSITIVITY) - Abnormal; Notable for the following components:   Troponin I (High Sensitivity) 370 (*)    All other components within normal limits  RESP PANEL BY RT-PCR (FLU A&B, COVID) ARPGX2  OCCULT BLOOD X 1 CARD TO LAB, STOOL  RETICULOCYTES  IRON AND TIBC  FERRITIN  HEPATIC FUNCTION PANEL  POC OCCULT BLOOD, ED  TYPE AND SCREEN  PREPARE RBC (CROSSMATCH)  TROPONIN I (HIGH SENSITIVITY)    EKG None  Radiology DG Chest 2 View  Result Date: 04/28/2021 CLINICAL DATA:  Shortness of breath EXAM: CHEST - 2 VIEW COMPARISON:  07/18/2011 FINDINGS: Previous sternotomy and aortic valve replacement. Heart size upper limits of normal. Atherosclerosis and tortuosity of the thoracic aorta. Bilateral pleural effusions with dependent atelectasis. Upper lungs are clear. No acute bone finding. IMPRESSION: Previous aortic valve replacement. Borderline cardiomegaly and aortic atherosclerosis. Bilateral small pleural effusions with dependent pulmonary atelectasis. Electronically Signed   By: Paulina Fusi M.D.   On: 04/28/2021 12:03    Procedures .Critical Care Performed by: Blane Ohara, MD Authorized  by: Blane Ohara, MD   Critical care provider statement:    Critical care time (minutes):  40   Critical care start time:  04/28/2021 1:00 PM   Critical care end time:  04/28/2021 1:40 PM   Critical care time was exclusive of:  Separately billable procedures and treating other patients and teaching time   Critical care was necessary to treat or prevent imminent or life-threatening deterioration of the following conditions:  Cardiac failure   Critical care was time spent personally by me on the following activities:  Development of treatment plan with patient or surrogate, discussions with consultants, evaluation of patient's response to treatment, examination of patient, ordering and review of laboratory studies, ordering and review of radiographic studies, ordering and performing treatments and interventions, pulse oximetry, re-evaluation of patient's condition and review of old charts   Medications Ordered in ED Medications  albuterol (VENTOLIN HFA) 108 (90 Base) MCG/ACT inhaler 2 puff (has no administration in time range)  0.9 %  sodium chloride infusion (Manually program via Guardrails IV Fluids) (has no administration in time range)  hydrALAZINE (APRESOLINE) tablet 25 mg (has no administration in time range)  atorvastatin (LIPITOR) tablet 20 mg (has no administration in time range)  metoprolol succinate (TOPROL-XL) 24 hr tablet 25 mg (has no administration in time range)  vitamin B-12 (CYANOCOBALAMIN) tablet 1,000 mcg (has no administration in time range)  ondansetron (ZOFRAN) tablet 4 mg (has no administration in time range)    Or  ondansetron (ZOFRAN) injection 4 mg (has no administration in time range)    ED Course  I have reviewed the triage vital signs and the nursing notes.  Pertinent labs & imaging results that were available during my care of the patient were reviewed by me and considered in my medical decision making (see chart for details).  MDM Rules/Calculators/A&P                            Patient presents with exertional dyspnea discussed differential including heart failure/ACS, pleural effusion, anemia, metabolic, blood clots, other.  Patient blood work returned concerning with anemia 7.6 which is new no recent blood work on review of medical records.  Patient also has other cell lines decreased 3.9 white count and platelets mild thrombocytopenia.  No active bleeding.  Hemoccult negative.  Patient denies any known significant anemia history.  Discussed type and screen and patient may end up requiring a unit of blood.  Discussed with cardiology who will consult on the patient.  With recent chest pain and exertional dyspnea cardiac work-up initiated and troponin elevated 370, repeat pending, BNP 571 patient does have leg edema bilateral pitting.  EKG sinus rhythm with abnormal ST findings no STEMI.  Reviewed.  Patient having no chest pain.  Updated on plan for admission and further evaluation.  Discussed with hospitalist who agrees.  Final Clinical Impression(s) / ED Diagnoses Final diagnoses:  Symptomatic anemia  NSTEMI (non-ST elevated myocardial infarction) (HCC)  Exertional dyspnea  Leukopenia, unspecified type  Thrombocytopenia [facility])    Rx / DC Orders ED Discharge Orders     None        Elnora Morrison, MD 04/28/21 1536

## 2021-04-28 NOTE — ED Provider Notes (Signed)
Emergency Medicine Provider Triage Evaluation Note  Patrick Henson , a 80 y.o. male  was evaluated in triage.  Pt complains of DOE for the past 10 days.  Denies any chest pain, leg swelling, weakness.  Denies any nausea, vomiting, syncope, abdominal pain, diaphoresis.  History pertinent for hypertension and diabetes.  No SOB at rest. Denies any COPD or CHF.  Review of Systems  Positive: DOE Negative: Headedness, nausea, vomiting, syncope, abdominal pain, diaphoresis, leg swelling, chest pain  Physical Exam  BP 119/64 (BP Location: Right Arm)   Pulse 71   Temp 97.7 F (36.5 C)   Resp 17   SpO2 100%  Gen:   Awake, no distress   Resp:  Normal effort CTAB MSK:   Moves extremities without difficulty  Other:  Cranial nerves II through XII intact.  Strength intact bilaterally.  Medical Decision Making  Medically screening exam initiated at 11:44 AM.  Appropriate orders placed.  Patrick Henson was informed that the remainder of the evaluation will be completed by another provider, this initial triage assessment does not replace that evaluation, and the importance of remaining in the ED until their evaluation is complete.  Labs and imaging placed.   Achille Rich, PA-C 04/28/21 1146    Blane Ohara, MD 04/29/21 1341

## 2021-04-28 NOTE — H&P (Addendum)
History and Physical    Patrick Henson BOF:[id number] DOB: 04-21-1941 DOA: 04/28/2021  PCP: Catha Gosselin, MD (Confirm with patient/family/NH records and if not entered, this has to be entered at [facility] point of entry) Patient coming from: Home  I have personally briefly reviewed patient's old medical records in [facility]  Chief Complaint: SOB  HPI: Patrick Henson is a 80 y.o. male with medical history significant of aortic insufficiency status post biosynthetic aortic valve replacement 2013, HTN, HLD, IIDM, OSA on BIPAP, presented with new onset of shortness of breath.  Symptoms started about 2 to 3 weeks ago and gradually getting worse, associated with orthopnea, and productive cough for 1 week.  Phlegm has been clear, no fever or chills.  Patient also reported several episodes of " heart burning" sensation in the last 3 days, lasted for 10 to 20 minutes, no chest pains, no associated event such as nauseous vomiting or abdominal pain.  He denied any dark-colored stools or blood in the stool, he has chronic constipation.  This week, temporary walk a distance from the front door to parking garage without feeling of shortness of breath.  He also occasionally has lightheadedness when standing up, but he attributed that to vertigo.  No history of GI bleed, he had colonoscopy about 5 years ago for " routine study".  Patient has been on the diet for the last 2 years and managed to lose about 20 pounds.  ED Course: Blood pressure borderline low, borderline bradycardia, blood work, hemoglobin 7.9 compared to 12.73 years ago, WBC 3.9, platelet 147, creatinine 1.2, BUN 20.  Review of Systems: As per HPI otherwise 14 point review of systems negative.    Past Medical History:  Diagnosis Date   Aortic stenosis    a. s/p bioprosthetic AVR at [facility] 2013.   Arthritis    BPPV (benign paroxysmal positional vertigo)    CAD (coronary artery disease)    a. cath report 2013: "Heavy coronary calcification with up  to 50% eccentric mid to proximal LAD, 50-70% ostial diagonal, 40% first obtuse marginal, and 50-60% proximal codominant RCA. I believe that all of the patient's coronary lesions are less than hemodynamically significant. "   Diabetes mellitus    Hyperlipidemia    Hypertension    Memory loss    Mild aortic regurgitation    Mild mitral regurgitation    Obesity (BMI 30-39.9)    Obstructive sleep apnea     Past Surgical History:  Procedure Laterality Date   AORTIC VALVE REPLACEMENT     per patient reports, performed at [facility] in 2013 and then had rehab   APPENDECTOMY     CATARACT EXTRACTION Bilateral    FEMORAL HERNIA REPAIR     left knee replacement     2006   right hip placement     2007   TONSILLECTOMY       reports that he has never smoked. He has never used smokeless tobacco. He reports that he does not drink alcohol and does not use drugs.  Allergies  Allergen Reactions   Actos [Pioglitazone] Swelling   Dulaglutide Other (See Comments)    Diarrhea   Tradjenta [Linagliptin]     Pt unsure of his reaction    Family History  Problem Relation Age of Onset   Heart attack Mother    Heart disease Mother    Diabetes Mother    Cancer Mother    Heart attack Father    Skin cancer Sister  Prior to Admission medications   Medication Sig Start Date End Date Taking? Authorizing Provider  amLODipine (NORVASC) 10 MG tablet Take 1 tablet (10 mg total) by mouth daily. Please keep upcoming appt. In Nov. In order to receive future refills. Patient taking differently: Take 5 mg by mouth daily. 03/15/21  Yes Lyn Records, MD  amoxicillin (AMOXIL) 500 MG capsule Take 4 capsules by mouth as directed. Prior to dental appointment 07/17/18  Yes [provider]  Ascorbic Acid (VITAMIN C PO) Take 2 tablets by mouth daily.   Yes [provider]  atorvastatin (LIPITOR) 20 MG tablet Take 20 mg by mouth daily.   Yes [provider]  cholecalciferol (VITAMIN D3) 25  MCG (1000 UT) tablet Take 2,000 Units by mouth daily.   Yes [provider]  lisinopril (ZESTRIL) 20 MG tablet Take 20 mg by mouth daily. 11/07/19  Yes [provider]  metoprolol succinate (TOPROL-XL) 25 MG 24 hr tablet Take 25 mg by mouth daily.   Yes [provider]  vitamin B-12 (CYANOCOBALAMIN) 1000 MCG tablet Take 1,000 mcg by mouth daily.   Yes [provider]    Physical Exam: Vitals:   04/28/21 1056 04/28/21 1307 04/28/21 1449  BP: 119/64 108/71 120/65  Pulse: 71 66 (!) 57  Resp: 17 16 16   Temp: 97.7 F (36.5 C)    SpO2: 100% 100% 98%    Constitutional: NAD, calm, comfortable Vitals:   04/28/21 1056 04/28/21 1307 04/28/21 1449  BP: 119/64 108/71 120/65  Pulse: 71 66 (!) 57  Resp: 17 16 16   Temp: 97.7 F (36.5 C)    SpO2: 100% 100% 98%   Eyes: PERRL, lids and conjunctivae normal ENMT: Mucous membranes are moist. Posterior pharynx clear of any exudate or lesions.Normal dentition.  Neck: normal, supple, no masses, no thyromegaly Respiratory: clear to auscultation bilaterally, no wheezing, no crackles. Normal respiratory effort. No accessory muscle use.  Cardiovascular: Regular rate and rhythm, no murmurs / rubs / gallops. No extremity edema. 2+ pedal pulses. No carotid bruits.  Abdomen: no tenderness, no masses palpated. No hepatosplenomegaly. Bowel sounds positive.  Musculoskeletal: no clubbing / cyanosis. No joint deformity upper and lower extremities. Good ROM, no contractures. Normal muscle tone.  Skin: no rashes, lesions, ulcers. No induration Neurologic: CN 2-12 grossly intact. Sensation intact, DTR normal. Strength 5/5 in all 4.  Psychiatric: Normal judgment and insight. Alert and oriented x 3. Normal mood.     Labs on Admission: I have personally reviewed following labs and imaging studies  CBC: Recent Labs  Lab 04/28/21 1106  WBC 3.9*  HGB 7.6*  HCT 24.0*  MCV 82.8  PLT 147*   Basic Metabolic Panel: Recent Labs   Lab 04/28/21 1106  NA 134*  K 4.5  CL 105  CO2 23  GLUCOSE 147*  BUN 20  CREATININE 1.25*  CALCIUM 8.5*   GFR: CrCl cannot be calculated (Unknown ideal weight.). Liver Function Tests: No results for input(s): AST, ALT, ALKPHOS, BILITOT, PROT, ALBUMIN in the last 168 hours. No results for input(s): LIPASE, AMYLASE in the last 168 hours. No results for input(s): AMMONIA in the last 168 hours. Coagulation Profile: No results for input(s): INR, PROTIME in the last 168 hours. Cardiac Enzymes: No results for input(s): CKTOTAL, CKMB, CKMBINDEX, TROPONINI in the last 168 hours. BNP (last 3 results) No results for input(s): PROBNP in the last 8760 hours. HbA1C: No results for input(s): HGBA1C in the last 72 hours. CBG: No results for  input(s): GLUCAP in the last 168 hours. Lipid Profile: No results for input(s): CHOL, HDL, LDLCALC, TRIG, CHOLHDL, LDLDIRECT in the last 72 hours. Thyroid Function Tests: No results for input(s): TSH, T4TOTAL, FREET4, T3FREE, THYROIDAB in the last 72 hours. Anemia Panel: No results for input(s): VITAMINB12, FOLATE, FERRITIN, TIBC, IRON, RETICCTPCT in the last 72 hours. Urine analysis: No results found for: COLORURINE, APPEARANCEUR, LABSPEC, PHURINE, GLUCOSEU, HGBUR, BILIRUBINUR, KETONESUR, PROTEINUR, UROBILINOGEN, NITRITE, LEUKOCYTESUR  Radiological Exams on Admission: DG Chest 2 View  Result Date: 04/28/2021 CLINICAL DATA:  Shortness of breath EXAM: CHEST - 2 VIEW COMPARISON:  07/18/2011 FINDINGS: Previous sternotomy and aortic valve replacement. Heart size upper limits of normal. Atherosclerosis and tortuosity of the thoracic aorta. Bilateral pleural effusions with dependent atelectasis. Upper lungs are clear. No acute bone finding. IMPRESSION: Previous aortic valve replacement. Borderline cardiomegaly and aortic atherosclerosis. Bilateral small pleural effusions with dependent pulmonary atelectasis. Electronically Signed   By: Paulina Fusi M.D.   On:  04/28/2021 12:03    EKG: Independently reviewed.  Chronic RBBB, no acute ST-T changes.  Assessment/Plan Principal Problem:   Anemia  (please populate well all problems here in Problem List. (For example, if patient is on BP meds at home and you resume or decide to hold them, it is a problem that needs to be her. Same for CAD, COPD, HLD and so on)  Symptomatic anemia -No symptoms or signs of GI bleed, no abdominal discomfort, no observation of dark-colored stool or blood in stool, FOBT negative x1 in the ED. -PRBC x1, aiming at goal of Hb~9 -Start PPI x2 -Send iron studies, and depends on the result, will consult GI. -SCD for DVT prophylaxis for now. -PT evaluation starting tomorrow.  Acute decompensated CHF -Symptomatic anemia, management as above.  Clinically looks euvolemic, monitor off diuresis.  Cardiology on board.  Elevated troponins -No active chest pain, elevation of trop likely demand ischemia from symptomatic anemia -Cardiology consulted  Pancytopenia -Check reticulocyte count -No Hx of malignancy, no lymphadenopathy on physical exam, weight loss has been gradual and is intentional.  HTN -With borderline hypotension, monitor off home BP meds including beta-blocker and amlodipine -Start as needed hydralazine.  IIDM -Diet control  DVT prophylaxis: SCD Code Status: Full code Family Communication: Wife at bedside Disposition Plan: Expect more than 2 midnight hospital stay for blood transfusion, CHF treatment. Consults called: Cardiology Admission status: Tele admit   Emeline General MD [facility] Pager [phone]  04/28/2021, 3:43 PM

## 2021-04-28 NOTE — ED Notes (Signed)
Pt tolerating blood transfusion without evidence of reaction or fluid overload. VSS, denies needs, no distress noted. Call light in reach, wife at bedside.

## 2021-04-28 NOTE — ED Notes (Signed)
Pt had a 7 beat run of vtach just after cardiology left the room, RN printed strip and updated Drs. Eldridge Dace and [employer].

## 2021-04-28 NOTE — Progress Notes (Signed)
RT note: Spoke with patient and his wife about wearing a CPAP tonight. Wife and patient states he will not wear it and will only tolerate it for five minutes. Told patient and wife if they change their mind and wish to wear one to let RT know and we will set one up for them.

## 2021-04-29 ENCOUNTER — Inpatient Hospital Stay (HOSPITAL_BASED_OUTPATIENT_CLINIC_OR_DEPARTMENT_OTHER): Payer: Medicare HMO

## 2021-04-29 DIAGNOSIS — Z794 Long term (current) use of insulin: Secondary | ICD-10-CM | POA: Diagnosis not present

## 2021-04-29 DIAGNOSIS — L89301 Pressure ulcer of unspecified buttock, stage 1: Secondary | ICD-10-CM | POA: Diagnosis not present

## 2021-04-29 DIAGNOSIS — N183 Chronic kidney disease, stage 3 unspecified: Secondary | ICD-10-CM

## 2021-04-29 DIAGNOSIS — G4733 Obstructive sleep apnea (adult) (pediatric): Secondary | ICD-10-CM

## 2021-04-29 DIAGNOSIS — D72819 Decreased white blood cell count, unspecified: Secondary | ICD-10-CM | POA: Diagnosis not present

## 2021-04-29 DIAGNOSIS — I5030 Unspecified diastolic (congestive) heart failure: Secondary | ICD-10-CM

## 2021-04-29 DIAGNOSIS — E8770 Fluid overload, unspecified: Secondary | ICD-10-CM | POA: Diagnosis not present

## 2021-04-29 DIAGNOSIS — E7849 Other hyperlipidemia: Secondary | ICD-10-CM | POA: Diagnosis not present

## 2021-04-29 DIAGNOSIS — I10 Essential (primary) hypertension: Secondary | ICD-10-CM | POA: Diagnosis not present

## 2021-04-29 DIAGNOSIS — D649 Anemia, unspecified: Secondary | ICD-10-CM | POA: Diagnosis not present

## 2021-04-29 DIAGNOSIS — Z952 Presence of prosthetic heart valve: Secondary | ICD-10-CM | POA: Diagnosis not present

## 2021-04-29 DIAGNOSIS — E118 Type 2 diabetes mellitus with unspecified complications: Secondary | ICD-10-CM | POA: Diagnosis not present

## 2021-04-29 DIAGNOSIS — M1990 Unspecified osteoarthritis, unspecified site: Secondary | ICD-10-CM

## 2021-04-29 DIAGNOSIS — D696 Thrombocytopenia, unspecified: Secondary | ICD-10-CM | POA: Diagnosis not present

## 2021-04-29 LAB — TYPE AND SCREEN
ABO/RH(D): O POS
Antibody Screen: NEGATIVE
Unit division: 0

## 2021-04-29 LAB — COMPREHENSIVE METABOLIC PANEL
ALT: 17 U/L (ref 0–44)
AST: 16 U/L (ref 15–41)
Albumin: 3.2 g/dL — ABNORMAL LOW (ref 3.5–5.0)
Alkaline Phosphatase: 48 U/L (ref 38–126)
Anion gap: 10 (ref 5–15)
BUN: 19 mg/dL (ref 8–23)
CO2: 21 mmol/L — ABNORMAL LOW (ref 22–32)
Calcium: 8.1 mg/dL — ABNORMAL LOW (ref 8.9–10.3)
Chloride: 106 mmol/L (ref 98–111)
Creatinine, Ser: 1.42 mg/dL — ABNORMAL HIGH (ref 0.61–1.24)
GFR, Estimated: 49 mL/min — ABNORMAL LOW (ref >60)
Glucose, Bld: 116 mg/dL — ABNORMAL HIGH (ref 70–99)
Potassium: 3.7 mmol/L (ref 3.5–5.1)
Sodium: 137 mmol/L (ref 135–145)
Total Bilirubin: 2 mg/dL — ABNORMAL HIGH (ref 0.3–1.2)
Total Protein: 5.6 g/dL — ABNORMAL LOW (ref 6.5–8.1)

## 2021-04-29 LAB — CBC
HCT: 25.7 % — ABNORMAL LOW (ref 39.0–52.0)
Hemoglobin: 8.4 g/dL — ABNORMAL LOW (ref 13.0–17.0)
MCH: 26.9 pg (ref 26.0–34.0)
MCHC: 32.7 g/dL (ref 30.0–36.0)
MCV: 82.4 fL (ref 80.0–100.0)
Platelets: 136 10*3/uL — ABNORMAL LOW (ref 150–400)
RBC: 3.12 MIL/uL — ABNORMAL LOW (ref 4.22–5.81)
RDW: 13.7 % (ref 11.5–15.5)
WBC: 4.7 10*3/uL (ref 4.0–10.5)
nRBC: 0 % (ref 0.0–0.2)

## 2021-04-29 LAB — BPAM RBC
Blood Product Expiration Date: 202212282359
ISSUE DATE / TIME: 202211301633
Unit Type and Rh: 5100

## 2021-04-29 LAB — ECHOCARDIOGRAM COMPLETE
AR max vel: 1.29 cm2
AV Area VTI: 1.23 cm2
AV Area mean vel: 1.25 cm2
AV Mean grad: 20.5 mmHg
AV Peak grad: 35.6 mmHg
Ao pk vel: 2.99 m/s
Area-P 1/2: 3.99 cm2
Calc EF: 58.8 %
MV M vel: 4.45 m/s
MV Peak grad: 79 mmHg
MV VTI: 1.84 cm2
P 1/2 time: 445 msec
S' Lateral: 4.3 cm
Single Plane A2C EF: 57 %
Single Plane A4C EF: 57.8 %

## 2021-04-29 MED ORDER — ALBUTEROL SULFATE (2.5 MG/3ML) 0.083% IN NEBU: 2.5000 mg | INHALATION_SOLUTION | RESPIRATORY_TRACT | Status: DC | PRN

## 2021-04-29 MED ORDER — BISACODYL 5 MG PO TBEC
10.0000 mg | DELAYED_RELEASE_TABLET | Freq: Once | ORAL | Status: AC
  Administered 2021-04-29: 10 mg via ORAL
  Filled 2021-04-29: qty 2

## 2021-04-29 NOTE — TOC Transition Note (Signed)
Transition of Care [facility]) - CM/SW Discharge Note   Patient Details  Name: Patrick Henson MRN: [id number] Date of Birth: 1940/07/12  [facility] CM/SW Contact:  Lockie Pares, RN Phone Number: 04/29/2021, 4:58 PM   Clinical Narrative:     Patient presented with gradually worsening dyspnea.. Hbg and HCT were low, dx with symptomatic anemia.  PT recommended HH PT and RW, messaged MD to order PT [facility]. Accepted by [facility]. RW will be drop shipped to home. Called patient, in testing. Will present with code 44 post testing.         Patient Goals and CMS Choice        Discharge Placement  Home with Home Health.                     Discharge Plan and Services                DME Arranged: Dan Humphreys DME Agency: [employer] Date DME Agency Contacted: 04/29/21 Time DME Agency Contacted: [phone] Representative spoke with at DME Agency: Maud Deed HH Arranged: PT HH Agency: [facility] Date [facility] Agency Contacted: 04/29/21 Time HH Agency Contacted: 1657 Representative spoke with at [facility] Agency: Kandee Keen  Social Determinants of Health (SDOH) Interventions     Readmission Risk Interventions No flowsheet data found.

## 2021-04-29 NOTE — Plan of Care (Signed)
  Problem: Education: Goal: Knowledge of General Education information will improve Description: Including pain rating scale, medication(s)/side effects and non-pharmacologic comfort measures Outcome: Progressing   Problem: Clinical Measurements: Goal: Ability to maintain clinical measurements within normal limits will improve Outcome: Progressing   

## 2021-04-29 NOTE — ED Notes (Signed)
Bedpan set up at bedside for patient to use in case if he want to have a bowl movement. Patient given Miralax.

## 2021-04-29 NOTE — Telephone Encounter (Signed)
Prior Authorization for titration sent to [facility] via web portal. AUTH Number [id number] Valid dates  05/03/21--06/02/2021.Marland Kitchen

## 2021-04-29 NOTE — Progress Notes (Signed)
Pt refused CPAP. RT will monitor as needed 

## 2021-04-29 NOTE — Progress Notes (Addendum)
PROGRESS NOTE    Patrick Henson  RXV:[id number] DOB: 1941/03/10 DOA: 04/28/2021 PCP: Patrick Gosselin, MD    Brief Narrative:  Patrick Henson was admitted to the hospital with the working diagnosis of symptomatic anemia.   80 yo male with the past medical history of aortic insufficiency sp bioprosthetic valve replacement in 2013, HTN, dyslipidemia, T2DM, and OSA who presented with dyspnea.  Reported 2 to 3 weeks of gradually worsening dyspnea, associated with orthopnea and productive cough.  Intermittent chest heartburning. On his initial physical examination his blood pressure was 119/64, HR 71, RR 17, oxygen saturation 100%, temp 97,7.  His lungs are clear to auscultation bilaterally, heart S1-S2, present, rhythmic, soft abdomen, no lower extremity edema.  Sodium 134, potassium 4.5, chloride 105, bicarb 23, glucose 147, BUN 20, creatinine 1.25.  BNP 571, high sensitive troponin 370-431, white count 3.9, hemoglobin 7.6, hematocrit 24.0, platelets 147. SARS COVID-19 negative.  Chest radiograph with bibasilar atelectasis.  EKG 69 bpm, left axis deviation, left anterior fascicular block, right bundle branch block, second-degree AV block type I, no significant ST segment or T wave changes.  Patient received 1 unit packed red blood cells.  Assessment & Plan:   Principal Problem:   Symptomatic anemia Active Problems:   Type 2 diabetes mellitus with complication, with long-term current use of insulin (HCC)   Hypertension   Hyperlipidemia   Obstructive sleep apnea   Arthritis   Leukopenia   Thrombocytopenia (HCC)   Symptomatic anemia. Leukopenia and thrombocytopenia. Sp 1 unit PRBC transfusion with good toleration, this am with no dyspnea at rest or chest pain.  No signs of active bleeding, patient has been constipated for about 5 days.   Plan to follow up Hgb in am. Continue bowel regimen. Patient had colonoscopy many years ago. Her daughter will get all information.  Out of bed to chair tid  with meals, follow up with PT and OT evaluation. Check iron panel.   2. Diastolic heart failure. SP aortic valve replacement. Patient with no signs of acute heart failure exacerbation (ruled out). Continue blood pressure monitoring.  Follow up echocardiogram per cardiology recommendations.   3. HTN. Continue blood pressure monitoring. Continue metoprolol and holding on lisinopril.   4. T2DM. dyslipidemia Glucose cover and monitoring with insulin sliding scale.  Continue with statin therapy.   5. AKI on CKD stage 2. Renal function with worsening cr at 1,42, with K at 3,7 and serum bicarbonate at 21.  Continue to hold nephrotoxic medications and avoid hypotension, follow up renal panel in am.   Patient continue to be at high risk for  worsening anemia   Status is: Inpatient  Remains inpatient appropriate because: cell count monitoring.    DVT prophylaxis: Scd    Code Status:    full  Family Communication:   I spoke with patient's daughter  at the bedside, we talked in detail about patient's condition, plan of care and prognosis and all questions were addressed.      Consultants:  Cardiology     Subjective: Patient is feeling better post PRBC transfusion no chest pain or dyspnea at rest, no nausea or vomiting.   Objective: Vitals:   04/29/21 0602 04/29/21 0645 04/29/21 0705 04/29/21 1000  BP:   126/70 114/62  Pulse: 68  62 82  Resp: 15 18 18  (!) 21  Temp:      TempSrc:      SpO2: 93%  99% 96%    Intake/Output Summary (Last 24 hours) at 04/29/2021  1038 Last data filed at 04/29/2021 0240 Gross per 24 hour  Intake 365 ml  Output 3850 ml  Net -3485 ml   There were no vitals filed for this visit.  Examination:   General: Not in pain or dyspnea, deconditioned  Neurology: Awake and alert, non focal  E ENT: positive pallor, no icterus, oral mucosa moist Cardiovascular: No JVD. S1-S2 present, rhythmic, no gallops, rubs, or murmurs. No lower extremity edema. Pulmonary:  positive breath sounds bilaterally, adequate air movement, no wheezing, rhonchi or rales. Gastrointestinal. Abdomen soft and non tender Skin. No rashes Musculoskeletal: no joint deformities     Data Reviewed: I have personally reviewed following labs and imaging studies  CBC: Recent Labs  Lab 04/28/21 1106 04/29/21 0234  WBC 3.9* 4.7  HGB 7.6* 8.4*  HCT 24.0* 25.7*  MCV 82.8 82.4  PLT 147* [id number]*   Basic Metabolic Panel: Recent Labs  Lab 04/28/21 1106 04/29/21 0234  NA 134* 137  K 4.5 3.7  CL 105 106  CO2 23 21*  GLUCOSE 147* 116*  BUN 20 19  CREATININE 1.25* 1.42*  CALCIUM 8.5* 8.1*   GFR: CrCl cannot be calculated (Unknown ideal weight.). Liver Function Tests: Recent Labs  Lab 04/29/21 0234  AST 16  ALT 17  ALKPHOS 48  BILITOT 2.0*  PROT 5.6*  ALBUMIN 3.2*   No results for input(s): LIPASE, AMYLASE in the last 168 hours. No results for input(s): AMMONIA in the last 168 hours. Coagulation Profile: No results for input(s): INR, PROTIME in the last 168 hours. Cardiac Enzymes: No results for input(s): CKTOTAL, CKMB, CKMBINDEX, TROPONINI in the last 168 hours. BNP (last 3 results) No results for input(s): PROBNP in the last 8760 hours. HbA1C: No results for input(s): HGBA1C in the last 72 hours. CBG: No results for input(s): GLUCAP in the last 168 hours. Lipid Profile: No results for input(s): CHOL, HDL, LDLCALC, TRIG, CHOLHDL, LDLDIRECT in the last 72 hours. Thyroid Function Tests: No results for input(s): TSH, T4TOTAL, FREET4, T3FREE, THYROIDAB in the last 72 hours. Anemia Panel: No results for input(s): VITAMINB12, FOLATE, FERRITIN, TIBC, IRON, RETICCTPCT in the last 72 hours.    Radiology Studies: I have reviewed all of the imaging during this hospital visit personally     Scheduled Meds:  atorvastatin  20 mg Oral Daily   metoprolol succinate  25 mg Oral Daily   pantoprazole  40 mg Oral BID AC   polyethylene glycol  17 g Oral BID    vitamin B-12  1,000 mcg Oral Daily   Continuous Infusions:   LOS: 1 day        Patrick Laymon Gerome Apley, MD

## 2021-04-29 NOTE — ED Notes (Signed)
Pt noted to be lying at edge of the bed, with oxygen removed from face. Pt repositioned and HOB elevated. 6LO2 via Corinne reapplied to pt and tolerated well. Bed locked and call light placed within reach.

## 2021-04-29 NOTE — Care Management CC44 (Signed)
Condition Code 44 Documentation Completed  Patient Details  Name: Patrick Henson MRN: [id number] Date of Birth: [birth year]   Condition Code 44 given:    Patient signature on Condition Code 44 notice:    Documentation of 2 MD's agreement:    Code 44 added to claim:       Michel Bickers, RN 04/29/2021, 6:06 PM

## 2021-04-29 NOTE — Evaluation (Signed)
Physical Therapy Evaluation Patient Details Name: Patrick Henson MRN: [id number] DOB: 24-Sep-1940 Today's Date: 04/29/2021  History of Present Illness  Pt is an 80 y/o male admitted 11/30 secondary to symptomatic anemia. PMH includes OSA on CPAP, CAD< HTN, BPPV, s/p AVR, DM.  Clinical Impression  Pt admitted secondary to problem above with deficits below. Pt requiring min to min guard A for mobility tasks within ED room. Educated about use of RW at home. Feel pt would benefit from HHPT at d/c, however, daughter would like to talk with pt's wife first. Will continue to follow acutely.        Recommendations for follow up therapy are one component of a multi-disciplinary discharge planning process, led by the attending physician.  Recommendations may be updated based on patient status, additional functional criteria and insurance authorization.  Follow Up Recommendations Home health PT    Assistance Recommended at Discharge Frequent or constant Supervision/Assistance  Functional Status Assessment Patient has had a recent decline in their functional status and demonstrates the ability to make significant improvements in function in a reasonable and predictable amount of time.  Equipment Recommendations  Rolling walker (2 wheels)    Recommendations for Other Services       Precautions / Restrictions Precautions Precautions: Fall Restrictions Weight Bearing Restrictions: No      Mobility  Bed Mobility Overal bed mobility: Needs Assistance Bed Mobility: Supine to Sit;Sit to Supine     Supine to sit: Min assist Sit to supine: Supervision   General bed mobility comments: Min A for trunk assist    Transfers Overall transfer level: Needs assistance Equipment used: 1 person hand held assist Transfers: Sit to/from Stand Sit to Stand: Min guard           General transfer comment: Min guard for safety and steadying.    Ambulation/Gait Ambulation/Gait assistance: Min assist;Min  guard Gait Distance (Feet): 10 Feet Assistive device: 1 person hand held assist Gait Pattern/deviations: Step-through pattern;Decreased stride length Gait velocity: Decreased     General Gait Details: Mild unsteadiness, especially when walking backwards at EOB. Min guard to min A for steadying. Educated about use of RW to increase safety.  Stairs            Wheelchair Mobility    Modified Rankin (Stroke Patients Only)       Balance Overall balance assessment: Needs assistance Sitting-balance support: No upper extremity supported;Feet supported Sitting balance-Leahy Scale: Good     Standing balance support: Single extremity supported Standing balance-Leahy Scale: Fair                               Pertinent Vitals/Pain      Home Living Family/patient expects to be discharged to:: Private residence Living Arrangements: Spouse/significant other Available Help at Discharge: Family;Available 24 hours/day Type of Home: House Home Access: Stairs to enter Entrance Stairs-Rails: Right Entrance Stairs-Number of Steps: 3   Home Layout: One level Home Equipment: None      Prior Function Prior Level of Function : Independent/Modified Independent                     Hand Dominance        Extremity/Trunk Assessment   Upper Extremity Assessment Upper Extremity Assessment: Defer to OT evaluation    Lower Extremity Assessment Lower Extremity Assessment: Generalized weakness    Cervical / Trunk Assessment Cervical / Trunk Assessment: Kyphotic  Communication  Communication: No difficulties  Cognition Arousal/Alertness: Awake/alert Behavior During Therapy: WFL for tasks assessed/performed Overall Cognitive Status: History of cognitive impairments - at baseline                                          General Comments General comments (skin integrity, edema, etc.): Pt's daughter present throughout    Exercises      Assessment/Plan    PT Assessment Patient needs continued PT services  PT Problem List Decreased strength;Decreased activity tolerance;Decreased balance;Decreased mobility;Decreased knowledge of use of DME;Decreased knowledge of precautions       PT Treatment Interventions DME instruction;Gait training;Functional mobility training;Stair training;Therapeutic activities;Therapeutic exercise;Balance training;Patient/family education    PT Goals (Current goals can be found in the Care Plan section)  Acute Rehab PT Goals Patient Stated Goal: to go home PT Goal Formulation: With patient Time For Goal Achievement: 05/13/21 Potential to Achieve Goals: Good    Frequency Min 3X/week   Barriers to discharge        Co-evaluation               AM-PAC PT "6 Clicks" Mobility  Outcome Measure Help needed turning from your back to your side while in a flat bed without using bedrails?: A Little Help needed moving from lying on your back to sitting on the side of a flat bed without using bedrails?: A Little Help needed moving to and from a bed to a chair (including a wheelchair)?: A Little Help needed standing up from a chair using your arms (e.g., wheelchair or bedside chair)?: A Little Help needed to walk in hospital room?: A Little Help needed climbing 3-5 steps with a railing? : A Lot 6 Click Score: 17    End of Session Equipment Utilized During Treatment: Gait belt Activity Tolerance: Patient tolerated treatment well Patient left: in bed;with call bell/phone within reach;with family/visitor present (on stretcher in ED) Nurse Communication: Mobility status PT Visit Diagnosis: Unsteadiness on feet (R26.81);Muscle weakness (generalized) (M62.81);Difficulty in walking, not elsewhere classified (R26.2)    Time: 9326-7124 PT Time Calculation (min) (ACUTE ONLY): 18 min   Charges:   PT Evaluation $PT Eval Low Complexity: 1 Low          Cindee Salt, DPT  Acute  Rehabilitation Services  Pager: [phone] Office: [phone]   Lehman Prom 04/29/2021, 3:46 PM

## 2021-04-29 NOTE — Progress Notes (Signed)
Progress Note  Patient Name: Patrick Henson Date of Encounter: 04/29/2021  [facility] Cardiologist: Lesleigh Noe, MD   Subjective   Feels okay this morning.  Still in emergency room.  Inpatient Medications    Scheduled Meds:  atorvastatin  20 mg Oral Daily   metoprolol succinate  25 mg Oral Daily   pantoprazole  40 mg Oral BID AC   polyethylene glycol  17 g Oral BID   vitamin B-12  1,000 mcg Oral Daily   Continuous Infusions:  PRN Meds: albuterol, hydrALAZINE, ondansetron **OR** ondansetron (ZOFRAN) IV   Vital Signs    Vitals:   04/29/21 0601 04/29/21 0602 04/29/21 0645 04/29/21 0705  BP:    126/70  Pulse: 68 68  62  Resp: 18 15 18 18   Temp:      TempSrc:      SpO2: 95% 93%  99%    Intake/Output Summary (Last 24 hours) at 04/29/2021 0928 Last data filed at 04/29/2021 0240 Gross per 24 hour  Intake 365 ml  Output 3850 ml  Net -3485 ml   Last 3 Weights 03/09/2021 04/15/2020 01/16/2020  Weight (lbs) 205 lb 216 lb 220 lb  Weight (kg) 92.987 kg 97.977 kg 99.791 kg      Telemetry    Normal sinus rhythm- Personally Reviewed  ECG    Normal sinus rhythm, prolonged PR interval, right bundle branch block, left anterior fascicular block- Personally Reviewed  Physical Exam   GEN: No acute distress.   Neck: No JVD Cardiac: RRR, no murmurs, rubs, or gallops.  Respiratory: Clear to auscultation bilaterally. GI: Soft, nontender, non-distended  MS: No edema; No deformity. Neuro:  Nonfocal  Psych: Normal affect   Labs    High Sensitivity Troponin:   Recent Labs  Lab 04/28/21 1106 04/28/21 1445  TROPONINIHS 370* 431*     Chemistry Recent Labs  Lab 04/28/21 1106 04/29/21 0234  NA 134* 137  K 4.5 3.7  CL 105 106  CO2 23 21*  GLUCOSE 147* 116*  BUN 20 19  CREATININE 1.25* 1.42*  CALCIUM 8.5* 8.1*  PROT  --  5.6*  ALBUMIN  --  3.2*  AST  --  16  ALT  --  17  ALKPHOS  --  48  BILITOT  --  2.0*  GFRNONAA 57* 49*  ANIONGAP 6 10    Lipids  No results for input(s): CHOL, TRIG, HDL, LABVLDL, LDLCALC, CHOLHDL in the last 168 hours.  Hematology Recent Labs  Lab 04/28/21 1106 04/29/21 0234  WBC 3.9* 4.7  RBC 2.90* 3.12*  HGB 7.6* 8.4*  HCT 24.0* 25.7*  MCV 82.8 82.4  MCH 26.2 26.9  MCHC 31.7 32.7  RDW 13.6 13.7  PLT 147* 136*   Thyroid No results for input(s): TSH, FREET4 in the last 168 hours.  BNP Recent Labs  Lab 04/28/21 1106  BNP 571.6*    DDimer No results for input(s): DDIMER in the last 168 hours.   Radiology    DG Chest 2 View  Result Date: 04/28/2021 CLINICAL DATA:  Shortness of breath EXAM: CHEST - 2 VIEW COMPARISON:  07/18/2011 FINDINGS: Previous sternotomy and aortic valve replacement. Heart size upper limits of normal. Atherosclerosis and tortuosity of the thoracic aorta. Bilateral pleural effusions with dependent atelectasis. Upper lungs are clear. No acute bone finding. IMPRESSION: Previous aortic valve replacement. Borderline cardiomegaly and aortic atherosclerosis. Bilateral small pleural effusions with dependent pulmonary atelectasis. Electronically Signed   By: 07/20/2011.D.  On: 04/28/2021 12:03    Cardiac Studies   Echo pending  Patient Profile     80 y.o. male with prior AVR, now with anemia  Assessment & Plan    Status post aortic valve: By exam, valve appears to be functioning well.  Obtaining echocardiogram today.  Anemia: His hemoglobin increased appropriately with 1 unit transfusion yesterday.  I explained to the family that this will be his key issue while he is in the hospital.  I suspect his symptoms of shortness of breath are more related to anemia than any specific cardiac cause, but echo will be helpful as his valve is 82 years old.  CKD III. Continue to monitor.      For questions or updates, please contact [facility] Please consult [email or web address] for contact info under        Signed, Lance Muss, MD  04/29/2021, 9:28 AM

## 2021-04-29 NOTE — Care Management Obs Status (Signed)
MEDICARE OBSERVATION STATUS NOTIFICATION   Patient Details  Name: Patrick Henson MRN: [id number] Date of Birth: 1941/02/28   Medicare Observation Status Notification Given:       Michel Bickers, RN 04/29/2021, 6:06 PM

## 2021-04-30 DIAGNOSIS — L8990 Pressure ulcer of unspecified site, unspecified stage: Secondary | ICD-10-CM | POA: Insufficient documentation

## 2021-04-30 DIAGNOSIS — E7849 Other hyperlipidemia: Secondary | ICD-10-CM | POA: Diagnosis not present

## 2021-04-30 DIAGNOSIS — Z794 Long term (current) use of insulin: Secondary | ICD-10-CM | POA: Diagnosis not present

## 2021-04-30 DIAGNOSIS — I10 Essential (primary) hypertension: Secondary | ICD-10-CM | POA: Diagnosis not present

## 2021-04-30 DIAGNOSIS — D649 Anemia, unspecified: Secondary | ICD-10-CM | POA: Diagnosis not present

## 2021-04-30 DIAGNOSIS — Z952 Presence of prosthetic heart valve: Secondary | ICD-10-CM | POA: Diagnosis not present

## 2021-04-30 DIAGNOSIS — E8770 Fluid overload, unspecified: Secondary | ICD-10-CM | POA: Diagnosis not present

## 2021-04-30 DIAGNOSIS — D72819 Decreased white blood cell count, unspecified: Secondary | ICD-10-CM | POA: Diagnosis not present

## 2021-04-30 DIAGNOSIS — L89301 Pressure ulcer of unspecified buttock, stage 1: Secondary | ICD-10-CM

## 2021-04-30 DIAGNOSIS — M1990 Unspecified osteoarthritis, unspecified site: Secondary | ICD-10-CM | POA: Diagnosis not present

## 2021-04-30 DIAGNOSIS — G4733 Obstructive sleep apnea (adult) (pediatric): Secondary | ICD-10-CM | POA: Diagnosis not present

## 2021-04-30 DIAGNOSIS — N183 Chronic kidney disease, stage 3 unspecified: Secondary | ICD-10-CM | POA: Diagnosis not present

## 2021-04-30 DIAGNOSIS — E118 Type 2 diabetes mellitus with unspecified complications: Secondary | ICD-10-CM | POA: Diagnosis not present

## 2021-04-30 DIAGNOSIS — D696 Thrombocytopenia, unspecified: Secondary | ICD-10-CM | POA: Diagnosis not present

## 2021-04-30 LAB — MAGNESIUM: Magnesium: 2.1 mg/dL (ref 1.7–2.4)

## 2021-04-30 LAB — TRANSFERRIN: Transferrin: 264 mg/dL (ref 180–329)

## 2021-04-30 LAB — CBC
HCT: 28.1 % — ABNORMAL LOW (ref 39.0–52.0)
Hemoglobin: 9.1 g/dL — ABNORMAL LOW (ref 13.0–17.0)
MCH: 26.3 pg (ref 26.0–34.0)
MCHC: 32.4 g/dL (ref 30.0–36.0)
MCV: 81.2 fL (ref 80.0–100.0)
Platelets: 149 10*3/uL — ABNORMAL LOW (ref 150–400)
RBC: 3.46 MIL/uL — ABNORMAL LOW (ref 4.22–5.81)
RDW: 13.8 % (ref 11.5–15.5)
WBC: 4.9 10*3/uL (ref 4.0–10.5)
nRBC: 0 % (ref 0.0–0.2)

## 2021-04-30 LAB — FERRITIN: Ferritin: 26 ng/mL (ref 24–336)

## 2021-04-30 LAB — BASIC METABOLIC PANEL
Anion gap: 6 (ref 5–15)
BUN: 17 mg/dL (ref 8–23)
CO2: 25 mmol/L (ref 22–32)
Calcium: 8.7 mg/dL — ABNORMAL LOW (ref 8.9–10.3)
Chloride: 104 mmol/L (ref 98–111)
Creatinine, Ser: 1.4 mg/dL — ABNORMAL HIGH (ref 0.61–1.24)
GFR, Estimated: 50 mL/min — ABNORMAL LOW (ref >60)
Glucose, Bld: 141 mg/dL — ABNORMAL HIGH (ref 70–99)
Potassium: 4.2 mmol/L (ref 3.5–5.1)
Sodium: 135 mmol/L (ref 135–145)

## 2021-04-30 LAB — RETICULOCYTES
Immature Retic Fract: 27.6 % — ABNORMAL HIGH (ref 2.3–15.9)
RBC.: 3.49 MIL/uL — ABNORMAL LOW (ref 4.22–5.81)
Retic Count, Absolute: 71.2 10*3/uL (ref 19.0–186.0)
Retic Ct Pct: 2 % (ref 0.4–3.1)

## 2021-04-30 LAB — IRON AND TIBC
Iron: 26 ug/dL — ABNORMAL LOW (ref 45–182)
Saturation Ratios: 7 % — ABNORMAL LOW (ref 17.9–39.5)
TIBC: 370 ug/dL (ref 250–450)
UIBC: 344 ug/dL

## 2021-04-30 LAB — LACTATE DEHYDROGENASE: LDH: 180 U/L (ref 98–192)

## 2021-04-30 MED ORDER — SODIUM CHLORIDE 0.9 % IV SOLN
250.0000 mg | Freq: Once | INTRAVENOUS | Status: AC
  Administered 2021-04-30: 250 mg via INTRAVENOUS
  Filled 2021-04-30: qty 20

## 2021-04-30 MED ORDER — METOPROLOL SUCCINATE ER 25 MG PO TB24: 12.5000 mg | ORAL_TABLET | Freq: Every day | ORAL | 0 refills | Status: AC

## 2021-04-30 MED ORDER — METOPROLOL SUCCINATE ER 25 MG PO TB24
12.5000 mg | ORAL_TABLET | Freq: Every day | ORAL | Status: DC
  Administered 2021-04-30: 12.5 mg via ORAL
  Filled 2021-04-30: qty 1

## 2021-04-30 NOTE — Progress Notes (Signed)
Physical Therapy Treatment Patient Details Name: Patrick Henson MRN: [id number] DOB: 08-Nov-1940 Today's Date: 04/30/2021   History of Present Illness Pt is an 80 y/o male admitted 11/30 secondary to symptomatic anemia. PMH includes OSA on CPAP, CAD< HTN, BPPV, s/p AVR, DM.    PT Comments    Pt is making great progress with mobility, getting closer to his baseline. Pt was able to ambulate at least 300 ft at a min guard assist level. However, displayed unsteadiness and risk for falls when ambulating without UE support. Pt benefited from having at least 1 UE support for improved stability this date. Encouraged pt to use his RW at home, but not sure he will as daughter reports he tends to avoid it. Pt also able to negotiate x3 stairs using a handrail at a min guard assist level without LOB, allowing him to safely enter/exit his home with close guarding by family. Will continue to follow acutely. Current recommendations remain appropriate.   Recommendations for follow up therapy are one component of a multi-disciplinary discharge planning process, led by the attending physician.  Recommendations may be updated based on patient status, additional functional criteria and insurance authorization.  Follow Up Recommendations  Home health PT     Assistance Recommended at Discharge Frequent or constant Supervision/Assistance  Equipment Recommendations  Rolling walker (2 wheels) (if does not already have one)    Recommendations for Other Services       Precautions / Restrictions Precautions Precautions: Fall Precaution Comments: watch HR Restrictions Weight Bearing Restrictions: No     Mobility  Bed Mobility Overal bed mobility: Needs Assistance Bed Mobility: Supine to Sit;Sit to Supine     Supine to sit: Supervision;HOB elevated Sit to supine: Supervision;HOB elevated   General bed mobility comments: Supervision for safet with HOB elevated.    Transfers Overall transfer level: Needs  assistance Equipment used: None Transfers: Sit to/from Stand Sit to Stand: Min guard           General transfer comment: Wide stance and some unsteadiness with trunk sway noted transferring to stand from low EOB, but no LOB, min guard for safety.    Ambulation/Gait Ambulation/Gait assistance: Min guard Gait Distance (Feet): 300 Feet Assistive device: IV Pole Gait Pattern/deviations: Step-through pattern;Decreased stride length;Wide base of support Gait velocity: Decreased Gait velocity interpretation: <1.8 ft/sec, indicate of risk for recurrent falls   General Gait Details: Pt initially taking a couple steps without UE support but displayed unsteadiness, thus cued pt to push IV pole, improved stability. No LOB, but slow wide steps, min guard for safety.   Stairs Stairs: Yes Stairs assistance: Min guard Stair Management: One rail Left;One rail Right;Step to pattern;Forwards Number of Stairs: 3 General stair comments: Ascends and descends without LOB, min guard for safety. Increased difficulty and slowed speed noted when descending compared to ascending.   Wheelchair Mobility    Modified Rankin (Stroke Patients Only)       Balance Overall balance assessment: Needs assistance Sitting-balance support: No upper extremity supported;Feet supported Sitting balance-Leahy Scale: Good     Standing balance support: Single extremity supported;No upper extremity supported Standing balance-Leahy Scale: Fair Standing balance comment: Able to take a couple steps without UE support but improved stability noted with at least 1 UE support.                            Cognition Arousal/Alertness: Awake/alert Behavior During Therapy: WFL for tasks assessed/performed Overall  Cognitive Status: History of cognitive impairments - at baseline                                 General Comments: hx of dementia, requires cues for sequencing at home, increasing  difficulty with memory        Exercises      General Comments General comments (skin integrity, edema, etc.): Educated pt and daughter PT would still recommend using the RW at home, but daughter reports he will likely resist      Pertinent Vitals/Pain Pain Assessment: No/denies pain    Home Living Family/patient expects to be discharged to:: Private residence Living Arrangements: Spouse/significant other Available Help at Discharge: Family;Available 24 hours/day Type of Home: House Home Access: Stairs to enter Entrance Stairs-Rails: Right Entrance Stairs-Number of Steps: 3   Home Layout: One level Home Equipment: None      Prior Function            PT Goals (current goals can now be found in the care plan section) Acute Rehab PT Goals Patient Stated Goal: to go home PT Goal Formulation: With patient/family Time For Goal Achievement: 05/13/21 Potential to Achieve Goals: Good Progress towards PT goals: Progressing toward goals    Frequency    Min 3X/week      PT Plan Current plan remains appropriate    Co-evaluation              AM-PAC PT "6 Clicks" Mobility   Outcome Measure  Help needed turning from your back to your side while in a flat bed without using bedrails?: A Little Help needed moving from lying on your back to sitting on the side of a flat bed without using bedrails?: A Little Help needed moving to and from a bed to a chair (including a wheelchair)?: A Little Help needed standing up from a chair using your arms (e.g., wheelchair or bedside chair)?: A Little Help needed to walk in hospital room?: A Little Help needed climbing 3-5 steps with a railing? : A Little 6 Click Score: 18    End of Session Equipment Utilized During Treatment: Gait belt Activity Tolerance: Patient tolerated treatment well Patient left: in bed;with call bell/phone within reach;with family/visitor present;with bed alarm set Nurse Communication: Mobility status PT  Visit Diagnosis: Unsteadiness on feet (R26.81);Muscle weakness (generalized) (M62.81);Difficulty in walking, not elsewhere classified (R26.2);Other abnormalities of gait and mobility (R26.89)     Time: 1433-1450 PT Time Calculation (min) (ACUTE ONLY): 17 min  Charges:  $Gait Training: 8-22 mins                     Raymond Gurney, PT, DPT Acute Rehabilitation Services  Pager: [phone] Office: [phone]    Jewel Baize 04/30/2021, 2:58 PM

## 2021-04-30 NOTE — Progress Notes (Signed)
OT Cancellation Note  Patient Details Name: Patrick Henson MRN: [id number] DOB: May 09, 1941   Cancelled Treatment:    Reason Eval/Treat Not Completed: Other (comment) Pt with 8 beat run of vtach this AM and given metoprolol. RN also reports plan to give pt iron and requests OT check back this PM if possible.   [name] 04/30/2021, 9:28 AM

## 2021-04-30 NOTE — Discharge Summary (Signed)
Physician Discharge Summary  Patrick Henson [id number] DOB: 06/01/1940 DOA: 04/28/2021  PCP: Hulan Fess, MD  Admit date: 04/28/2021 Discharge date: 04/30/2021  Admitted From: Home  Disposition:  Home   Recommendations for Outpatient Follow-up and new medication changes:  Follow up with Dr Eddie Dibbles practice in 7 to 10 days.  Follow up iron panel in 3 weeks.  Follow up renal function as outpatient in 7 days.  Hold lisinopril to prevent hypotension.  Metoprolol dose was reduced to 12.5 mg daily (succinate).    Home Health: yes   Equipment/Devices: na   Discharge Condition: stable  CODE STATUS: full  Diet recommendation:  heart healthy   Brief/Interim Summary: Patrick Henson was admitted to the hospital with the working diagnosis of symptomatic anemia.    80 yo male with the past medical history of aortic insufficiency sp bioprosthetic valve replacement in 2013, HTN, dyslipidemia, T2DM, and OSA who presented with dyspnea.  Reported 2 to 3 weeks of gradually worsening dyspnea, associated with orthopnea and productive cough.  Intermittent chest heartburning. On his initial physical examination his blood pressure was 119/64, HR 71, RR 17, oxygen saturation 100%, temp 97,7.  His lungs were clear to auscultation bilaterally, heart S1-S2, present, rhythmic, soft abdomen, no lower extremity edema.   Sodium 134, potassium 4.5, chloride 105, bicarb 23, glucose 147, BUN 20, creatinine 1.25.  BNP 571, high sensitive troponin 370-431, white count 3.9, hemoglobin 7.6, hematocrit 24.0, platelets 147. SARS COVID-19 negative.   Chest radiograph with bibasilar atelectasis.   EKG 69 bpm, left axis deviation, left anterior fascicular block, right bundle branch block, second-degree AV block type I, no significant ST segment or T wave changes.   Patient received 1 unit packed red blood cells with good toleration. Found to have iron deficiency and received IV iron.  Plan to follow up as outpatient.    Symptomatic anemia with iron deficiency. Leukopenia and thrombocytopenia.  Patient is feeling better after PRBC transfusion, dyspnea has improved and no chest pain.  Iron is 26, TIBC 379, transferrin saturation is 7, and ferritin 26,   Patient had colonoscopy with [facility] about 10 years ago, considering his leukopenia and thrombocytopenia he may have bone marrow dysfunction. With his advance age, likely not candidate to endoscopic procedures unless active bleeding.   Plan to follow up cell count and iron panel in 3 weeks as outpatient.   2. Diastolic heart failure/ SP aortic valve replacement/ non sustained VT episode/ bradycardia.  Patient was placed on telemetry monitoring, he had one episode of NSVT, and bradycardia. His metoprolol dose was reduced to 12,5 mg succinate.   Further work  up with echocardiogram showed LV 55 to 123456 EF, RV systolic function preserved, left atrial moderately dilated.  Plan to follow up with cardiology as outpatient.  3. HTN/ dyslipidemia. Continue blood pressure control with metoprolol. Blood pressure has been 93 to 123456 mmHg systolic Plan to continue holding lisinopril to prevent hypotension, continue blood pressure monitoring as outpatient.   Continue with atorvastatin.   4. AKI on CKD stage 2. Renal function with serum Na at 135, K is 4,2 and serum bicarbonate at 25. Serum cr is trending down to 1,40 from 1,42.  Follow up renal panel as outpatient, patient is tolerating po well.  5. Stage 1 coccyx pressure ulcer present on admission, continue with local skin care.    Discharge Diagnoses:  Principal Problem:   Symptomatic anemia Active Problems:   Type 2 diabetes mellitus with complication, with long-term current  use of insulin (HCC)   Hypertension   Hyperlipidemia   Obstructive sleep apnea   Arthritis   Leukopenia   Thrombocytopenia (HCC)   Pressure injury of skin    Discharge Instructions   Allergies as of 04/30/2021        Reactions   Actos [pioglitazone] Swelling   Dulaglutide Other (See Comments)   Diarrhea   Tradjenta [linagliptin]    Pt unsure of his reaction        Medication List     STOP taking these medications    amoxicillin 500 MG capsule Commonly known as: AMOXIL   lisinopril 20 MG tablet Commonly known as: ZESTRIL       TAKE these medications    amLODipine 10 MG tablet Commonly known as: NORVASC Take 1 tablet (10 mg total) by mouth daily. Please keep upcoming appt. In Nov. In order to receive future refills. What changed:  how much to take additional instructions   atorvastatin 20 MG tablet Commonly known as: LIPITOR Take 20 mg by mouth daily.   cholecalciferol 25 MCG (1000 UNIT) tablet Commonly known as: VITAMIN D3 Take 2,000 Units by mouth daily.   metoprolol succinate 25 MG 24 hr tablet Commonly known as: TOPROL-XL Take 0.5 tablets (12.5 mg total) by mouth daily. Start taking on: May 01, 2021 What changed: how much to take   vitamin B-12 1000 MCG tablet Commonly known as: CYANOCOBALAMIN Take 1,000 mcg by mouth daily.   VITAMIN C PO Take 2 tablets by mouth daily.               Durable Medical Equipment  (From admission, onward)           Start     Ordered   04/29/21 1653  For home use only DME Walker rolling  Once       Question Answer Comment  Walker: With 5 Inch Wheels   Patient needs a walker to treat with the following condition Weakness      04/29/21 1652            Allergies  Allergen Reactions   Actos [Pioglitazone] Swelling   Dulaglutide Other (See Comments)    Diarrhea   Tradjenta [Linagliptin]     Pt unsure of his reaction    Consultations: Cardiology    Procedures/Studies: DG Chest 2 View  Result Date: 04/28/2021 CLINICAL DATA:  Shortness of breath EXAM: CHEST - 2 VIEW COMPARISON:  07/18/2011 FINDINGS: Previous sternotomy and aortic valve replacement. Heart size upper limits of normal. Atherosclerosis and  tortuosity of the thoracic aorta. Bilateral pleural effusions with dependent atelectasis. Upper lungs are clear. No acute bone finding. IMPRESSION: Previous aortic valve replacement. Borderline cardiomegaly and aortic atherosclerosis. Bilateral small pleural effusions with dependent pulmonary atelectasis. Electronically Signed   By: Nelson Chimes M.D.   On: 04/28/2021 12:03   ECHOCARDIOGRAM COMPLETE  Result Date: 04/29/2021    ECHOCARDIOGRAM REPORT   Patient Name:   Patrick Henson [facility] Date of Exam: 04/29/2021 Medical Rec #:  [id number]   Height:       73.0 in Accession #:    [id number]  Weight:       205.0 lb Date of Birth:  02/15/1941    BSA:          2.174 m Patient Age:    24 years    BP:           121/64 mmHg Patient Gender: M  HR:           67 bpm. Exam Location:  Inpatient Procedure: 2D Echo, Cardiac Doppler and Color Doppler Indications:    CHF  History:        Patient has prior history of Echocardiogram examinations. Risk                 Factors:Hypertension, Diabetes and Sleep Apnea.                 Aortic Valve: bioprosthetic valve is present in the aortic                 position.  Sonographer:    Cleatis Polka Referring Phys: [phone] LINDSAY B ROBERTS IMPRESSIONS  1. S/P AVR with mild AI; mean gradient 21 mmHg; DI 0.39; compared to 01/14/20, mean gradient has increased (previously 16 mmHg).  2. Left ventricular ejection fraction, by estimation, is 55 to 60%. The left ventricle has normal function. The left ventricle has no regional wall motion abnormalities. The left ventricular internal cavity size was mildly dilated. There is mild left ventricular hypertrophy. Left ventricular diastolic parameters are consistent with Grade II diastolic dysfunction (pseudonormalization). Elevated left atrial pressure.  3. Right ventricular systolic function is normal. The right ventricular size is normal. There is mildly elevated pulmonary artery systolic pressure.  4. Left atrial size was moderately dilated.  5. The  mitral valve is normal in structure. Mild mitral valve regurgitation. No evidence of mitral stenosis. Moderate mitral annular calcification.  6. The aortic valve has been repaired/replaced. Aortic valve regurgitation is mild. There is a bioprosthetic valve present in the aortic position.  7. The inferior vena cava is dilated in size with >50% respiratory variability, suggesting right atrial pressure of 8 mmHg. Comparison(s): Prior images unable to be directly viewed, comparison made by report only. FINDINGS  Left Ventricle: Left ventricular ejection fraction, by estimation, is 55 to 60%. The left ventricle has normal function. The left ventricle has no regional wall motion abnormalities. The left ventricular internal cavity size was mildly dilated. There is  mild left ventricular hypertrophy. Left ventricular diastolic parameters are consistent with Grade II diastolic dysfunction (pseudonormalization). Elevated left atrial pressure. Right Ventricle: The right ventricular size is normal. Right ventricular systolic function is normal. There is mildly elevated pulmonary artery systolic pressure. The tricuspid regurgitant velocity is 2.99 m/s, and with an assumed right atrial pressure of 8 mmHg, the estimated right ventricular systolic pressure is 43.8 mmHg. Left Atrium: Left atrial size was moderately dilated. Right Atrium: Right atrial size was normal in size. Pericardium: There is no evidence of pericardial effusion. Mitral Valve: The mitral valve is normal in structure. Moderate mitral annular calcification. Mild mitral valve regurgitation. No evidence of mitral valve stenosis. MV peak gradient, 10.5 mmHg. The mean mitral valve gradient is 3.5 mmHg. Tricuspid Valve: The tricuspid valve is normal in structure. Tricuspid valve regurgitation is mild . No evidence of tricuspid stenosis. Aortic Valve: The aortic valve has been repaired/replaced. Aortic valve regurgitation is mild. Aortic regurgitation PHT measures 445  msec. Aortic valve mean gradient measures 20.5 mmHg. Aortic valve peak gradient measures 35.6 mmHg. Aortic valve area, by  VTI measures 1.23 cm. There is a bioprosthetic valve present in the aortic position. Pulmonic Valve: The pulmonic valve was normal in structure. Pulmonic valve regurgitation is trivial. No evidence of pulmonic stenosis. Aorta: The aortic root is normal in size and structure. Venous: The inferior vena cava is dilated in size with greater than 50%  respiratory variability, suggesting right atrial pressure of 8 mmHg. IAS/Shunts: No atrial level shunt detected by color flow Doppler. Additional Comments: S/P AVR with mild AI; mean gradient 21 mmHg; DI 0.39; compared to 01/14/20, mean gradient has increased (previously 16 mmHg).  LEFT VENTRICLE PLAX 2D LVIDd:         5.70 cm      Diastology LVIDs:         4.30 cm      LV e' medial:    5.55 cm/s LV PW:         1.40 cm      LV E/e' medial:  27.9 LV IVS:        1.10 cm      LV e' lateral:   7.94 cm/s LVOT diam:     2.00 cm      LV E/e' lateral: 19.5 LV SV:         86 LV SV Index:   40 LVOT Area:     3.14 cm  LV Volumes (MOD) LV vol d, MOD A2C: 210.0 ml LV vol d, MOD A4C: 207.0 ml LV vol s, MOD A2C: 90.4 ml LV vol s, MOD A4C: 87.3 ml LV SV MOD A2C:     119.6 ml LV SV MOD A4C:     207.0 ml LV SV MOD BP:      126.1 ml RIGHT VENTRICLE             IVC RV Basal diam:  3.60 cm     IVC diam: 2.70 cm RV Mid diam:    3.40 cm RV S prime:     12.30 cm/s TAPSE (M-mode): 2.2 cm LEFT ATRIUM             Index        RIGHT ATRIUM           Index LA diam:        4.70 cm 2.16 cm/m   RA Area:     15.60 cm LA Vol (A2C):   94.5 ml 43.46 ml/m  RA Volume:   37.70 ml  17.34 ml/m LA Vol (A4C):   89.6 ml 41.21 ml/m LA Biplane Vol: 94.4 ml 43.42 ml/m  AORTIC VALVE AV Area (Vmax):    1.29 cm AV Area (Vmean):   1.25 cm AV Area (VTI):     1.23 cm AV Vmax:           298.50 cm/s AV Vmean:          218.000 cm/s AV VTI:            0.702 m AV Peak Grad:      35.6 mmHg AV Mean  Grad:      20.5 mmHg LVOT Vmax:         123.00 cm/s LVOT Vmean:        86.650 cm/s LVOT VTI:          0.275 m LVOT/AV VTI ratio: 0.39 AI PHT:            445 msec  AORTA Ao Root diam: 3.00 cm Ao Asc diam:  3.60 cm MITRAL VALVE                TRICUSPID VALVE MV Area (PHT): 3.99 cm     TR Peak grad:   35.8 mmHg MV Area VTI:   1.84 cm     TR Vmax:        299.00 cm/s MV Peak grad:  10.5 mmHg MV Mean grad:  3.5 mmHg     SHUNTS MV Vmax:       1.62 m/s     Systemic VTI:  0.28 m MV Vmean:      89.6 cm/s    Systemic Diam: 2.00 cm MV Decel Time: 190 msec MR Peak grad: 79.0 mmHg MR Mean grad: 59.0 mmHg MR Vmax:      444.50 cm/s MR Vmean:     373.0 cm/s MV E velocity: 155.00 cm/s MV A velocity: 111.00 cm/s MV E/A ratio:  1.40 Kirk Ruths MD Electronically signed by Kirk Ruths MD Signature Date/Time: 04/29/2021/3:33:49 PM    Final        Subjective: Patient is feeling better, no nausea or vomiting, no chest pain. Dyspnea has improved.   Discharge Exam: Vitals:   04/30/21 0826 04/30/21 0837  BP: (!) 125/55 (!) 127/55  Pulse: 60 65  Resp:    Temp:    SpO2:     Vitals:   04/29/21 2317 04/30/21 0737 04/30/21 0826 04/30/21 0837  BP: (!) 151/63 99/73 (!) 125/55 (!) 127/55  Pulse: 70 74 60 65  Resp: 19 18    Temp: 98 F (36.7 C) 98.5 F (36.9 C)    TempSrc: Oral Oral    SpO2: 99% 100%    Weight:      Height:        General: Not in pain or dyspnea  Neurology: Awake and alert, non focal  E ENT: mild pallor, no icterus, oral mucosa moist Cardiovascular: No JVD. S1-S2 present, rhythmic, no gallops, rubs, positive systolic murmur at the right sternal border. No lower extremity edema. Pulmonary: positive breath sounds bilaterally, adequate air movement, no wheezing, rhonchi or rales. Gastrointestinal. Abdomen soft and non tender Skin. No rashes Musculoskeletal: no joint deformities   The results of significant diagnostics from this hospitalization (including imaging, microbiology, ancillary and  laboratory) are listed below for reference.     Microbiology: Recent Results (from the past 240 hour(s))  Resp Panel by RT-PCR (Flu A&B, Covid) Nasopharyngeal Swab     Status: None   Collection Time: 04/28/21  3:25 PM   Specimen: Nasopharyngeal Swab; Nasopharyngeal(NP) swabs in vial transport medium  Result Value Ref Range Status   SARS Coronavirus 2 by RT PCR NEGATIVE NEGATIVE Final    Comment: (NOTE) SARS-CoV-2 target nucleic acids are NOT DETECTED.  The SARS-CoV-2 RNA is generally detectable in upper respiratory specimens during the acute phase of infection. The lowest concentration of SARS-CoV-2 viral copies this assay can detect is 138 copies/mL. A negative result does not preclude SARS-Cov-2 infection and should not be used as the sole basis for treatment or other patient management decisions. A negative result may occur with  improper specimen collection/handling, submission of specimen other than nasopharyngeal swab, presence of viral mutation(s) within the areas targeted by this assay, and inadequate number of viral copies(<138 copies/mL). A negative result must be combined with clinical observations, patient history, and epidemiological information. The expected result is Negative.  Fact Sheet for Patients:  EntrepreneurPulse.com.au  Fact Sheet for Healthcare Providers:  IncredibleEmployment.be  This test is no t yet approved or cleared by the [place] FDA and  has been authorized for detection and/or diagnosis of SARS-CoV-2 by FDA under an Emergency Use Authorization (EUA). This EUA will remain  in effect (meaning this test can be used) for the duration of the COVID-19 declaration under Section 564(b)(1) of the Act, 21 U.S.C.section 360bbb-3(b)(1), unless the authorization is terminated  or revoked sooner.       Influenza A by PCR NEGATIVE NEGATIVE Final   Influenza B by PCR NEGATIVE NEGATIVE Final    Comment: (NOTE) The  Xpert Xpress SARS-CoV-2/FLU/RSV plus assay is intended as an aid in the diagnosis of influenza from Nasopharyngeal swab specimens and should not be used as a sole basis for treatment. Nasal washings and aspirates are unacceptable for Xpert Xpress SARS-CoV-2/FLU/RSV testing.  Fact Sheet for Patients: EntrepreneurPulse.com.au  Fact Sheet for Healthcare Providers: IncredibleEmployment.be  This test is not yet approved or cleared by the [place] FDA and has been authorized for detection and/or diagnosis of SARS-CoV-2 by FDA under an Emergency Use Authorization (EUA). This EUA will remain in effect (meaning this test can be used) for the duration of the COVID-19 declaration under Section 564(b)(1) of the Act, 21 U.S.C. section 360bbb-3(b)(1), unless the authorization is terminated or revoked.  Performed at [facility], [place] [address], [place], [place] [address]      Labs: BNP (last 3 results) Recent Labs    04/28/21 1106  BNP 0000000*   Basic Metabolic Panel: Recent Labs  Lab 04/28/21 1106 04/29/21 0234 04/30/21 0138  NA 134* 137 135  K 4.5 3.7 4.2  CL 105 106 104  CO2 23 21* 25  GLUCOSE 147* 116* 141*  BUN 20 19 17   CREATININE 1.25* 1.42* 1.40*  CALCIUM 8.5* 8.1* 8.7*  MG  --   --  2.1   Liver Function Tests: Recent Labs  Lab 04/29/21 0234  AST 16  ALT 17  ALKPHOS 48  BILITOT 2.0*  PROT 5.6*  ALBUMIN 3.2*   No results for input(s): LIPASE, AMYLASE in the last 168 hours. No results for input(s): AMMONIA in the last 168 hours. CBC: Recent Labs  Lab 04/28/21 1106 04/29/21 0234 04/30/21 0138  WBC 3.9* 4.7 4.9  HGB 7.6* 8.4* 9.1*  HCT 24.0* 25.7* 28.1*  MCV 82.8 82.4 81.2  PLT 147* 136* 149*   Cardiac Enzymes: No results for input(s): CKTOTAL, CKMB, CKMBINDEX, TROPONINI in the last 168 hours. BNP: Invalid input(s): POCBNP CBG: No results for input(s): GLUCAP in the last 168 hours. D-Dimer No results  for input(s): DDIMER in the last 72 hours. Hgb A1c No results for input(s): HGBA1C in the last 72 hours. Lipid Profile No results for input(s): CHOL, HDL, LDLCALC, TRIG, CHOLHDL, LDLDIRECT in the last 72 hours. Thyroid function studies No results for input(s): TSH, T4TOTAL, T3FREE, THYROIDAB in the last 72 hours.  Invalid input(s): FREET3 Anemia work up Recent Labs    04/30/21 0138  FERRITIN 26  TIBC 370  IRON 26*  RETICCTPCT 2.0   Urinalysis No results found for: COLORURINE, APPEARANCEUR, LABSPEC, [place], GLUCOSEU, [place], [place], [place], PROTEINUR, UROBILINOGEN, NITRITE, LEUKOCYTESUR Sepsis Labs Invalid input(s): PROCALCITONIN,  WBC,  LACTICIDVEN Microbiology Recent Results (from the past 240 hour(s))  Resp Panel by RT-PCR (Flu A&B, Covid) Nasopharyngeal Swab     Status: None   Collection Time: 04/28/21  3:25 PM   Specimen: Nasopharyngeal Swab; Nasopharyngeal(NP) swabs in vial transport medium  Result Value Ref Range Status   SARS Coronavirus 2 by RT PCR NEGATIVE NEGATIVE Final    Comment: (NOTE) SARS-CoV-2 target nucleic acids are NOT DETECTED.  The SARS-CoV-2 RNA is generally detectable in upper respiratory specimens during the acute phase of infection. The lowest concentration of SARS-CoV-2 viral copies this assay can detect is 138 copies/mL. A negative result does not preclude SARS-Cov-2 infection and should not be used as  the sole basis for treatment or other patient management decisions. A negative result may occur with  improper specimen collection/handling, submission of specimen other than nasopharyngeal swab, presence of viral mutation(s) within the areas targeted by this assay, and inadequate number of viral copies(<138 copies/mL). A negative result must be combined with clinical observations, patient history, and epidemiological information. The expected result is Negative.  Fact Sheet for Patients:  EntrepreneurPulse.com.au  Fact  Sheet for Healthcare Providers:  IncredibleEmployment.be  This test is no t yet approved or cleared by the [place] FDA and  has been authorized for detection and/or diagnosis of SARS-CoV-2 by FDA under an Emergency Use Authorization (EUA). This EUA will remain  in effect (meaning this test can be used) for the duration of the COVID-19 declaration under Section 564(b)(1) of the Act, 21 U.S.C.section 360bbb-3(b)(1), unless the authorization is terminated  or revoked sooner.       Influenza A by PCR NEGATIVE NEGATIVE Final   Influenza B by PCR NEGATIVE NEGATIVE Final    Comment: (NOTE) The Xpert Xpress SARS-CoV-2/FLU/RSV plus assay is intended as an aid in the diagnosis of influenza from Nasopharyngeal swab specimens and should not be used as a sole basis for treatment. Nasal washings and aspirates are unacceptable for Xpert Xpress SARS-CoV-2/FLU/RSV testing.  Fact Sheet for Patients: EntrepreneurPulse.com.au  Fact Sheet for Healthcare Providers: IncredibleEmployment.be  This test is not yet approved or cleared by the [place] FDA and has been authorized for detection and/or diagnosis of SARS-CoV-2 by FDA under an Emergency Use Authorization (EUA). This EUA will remain in effect (meaning this test can be used) for the duration of the COVID-19 declaration under Section 564(b)(1) of the Act, 21 U.S.C. section 360bbb-3(b)(1), unless the authorization is terminated or revoked.  Performed at [facility], [place] [address], [place], [place] [address]      Time coordinating discharge: 45 minutes  SIGNED:   Tawni Millers, MD  [facility] 04/30/2021, 11:59 AM

## 2021-04-30 NOTE — Progress Notes (Addendum)
Progress Note  Patient Name: Patrick Henson Date of Encounter: 04/30/2021  [facility] Cardiologist: Belva Crome III, MD   Subjective   Pt sitting up, pale. No complaints. Thankful to be out of the ER.  Inpatient Medications    Scheduled Meds:  atorvastatin  20 mg Oral Daily   pantoprazole  40 mg Oral BID AC   polyethylene glycol  17 g Oral BID   vitamin B-12  1,000 mcg Oral Daily   Continuous Infusions:  PRN Meds: albuterol, hydrALAZINE, ondansetron **OR** ondansetron (ZOFRAN) IV   Vital Signs    Vitals:   04/29/21 1950 04/29/21 2317 04/30/21 0737 04/30/21 0826  BP: 140/60 (!) 151/63 99/73 (!) 125/55  Pulse: 69 70 74 60  Resp: 17 19 18    Temp: 98 F (36.7 C) 98 F (36.7 C) 98.5 F (36.9 C)   TempSrc: Oral Oral Oral   SpO2: 100% 99% 100%   Weight: 94.4 kg     Height: 6\' 1"  (1.854 m)       Intake/Output Summary (Last 24 hours) at 04/30/2021 0829 Last data filed at 04/29/2021 2318 Gross per 24 hour  Intake 200 ml  Output 1175 ml  Net -975 ml   Last 3 Weights 04/29/2021 03/09/2021 04/15/2020  Weight (lbs) 208 lb 1.8 oz 205 lb 216 lb  Weight (kg) 94.4 kg 92.987 kg 97.977 kg      Telemetry    Sinus to sinus bradycardia with first degree heart block, 8 beat run of multifocal VT - Personally Reviewed  ECG    No new tracings - Personally Reviewed  Physical Exam   GEN: No acute distress, pale  Neck: No JVD Cardiac: RRR, no murmurs, rubs, or gallops.  Respiratory: Clear to auscultation bilaterally. GI: Soft, nontender, non-distended  MS: No edema; No deformity. Neuro:  Nonfocal  Psych: Normal affect   Labs    High Sensitivity Troponin:   Recent Labs  Lab 04/28/21 1106 04/28/21 1445  TROPONINIHS 370* 431*     Chemistry Recent Labs  Lab 04/28/21 1106 04/29/21 0234 04/30/21 0138  NA 134* 137 135  K 4.5 3.7 4.2  CL 105 106 104  CO2 23 21* 25  GLUCOSE 147* 116* 141*  BUN 20 19 17   CREATININE 1.25* 1.42* 1.40*  CALCIUM 8.5* 8.1* 8.7*   PROT  --  5.6*  --   ALBUMIN  --  3.2*  --   AST  --  16  --   ALT  --  17  --   ALKPHOS  --  48  --   BILITOT  --  2.0*  --   GFRNONAA 57* 49* 50*  ANIONGAP 6 10 6     Lipids No results for input(s): CHOL, TRIG, HDL, LABVLDL, LDLCALC, CHOLHDL in the last 168 hours.  Hematology Recent Labs  Lab 04/28/21 1106 04/29/21 0234 04/30/21 0138  WBC 3.9* 4.7 4.9  RBC 2.90* 3.12* 3.46*  3.49*  HGB 7.6* 8.4* 9.1*  HCT 24.0* 25.7* 28.1*  MCV 82.8 82.4 81.2  MCH 26.2 26.9 26.3  MCHC 31.7 32.7 32.4  RDW 13.6 13.7 13.8  PLT 147* 136* 149*   Thyroid No results for input(s): TSH, FREET4 in the last 168 hours.  BNP Recent Labs  Lab 04/28/21 1106  BNP 571.6*    DDimer No results for input(s): DDIMER in the last 168 hours.   Radiology    DG Chest 2 View  Result Date: 04/28/2021 CLINICAL DATA:  Shortness of breath  EXAM: CHEST - 2 VIEW COMPARISON:  07/18/2011 FINDINGS: Previous sternotomy and aortic valve replacement. Heart size upper limits of normal. Atherosclerosis and tortuosity of the thoracic aorta. Bilateral pleural effusions with dependent atelectasis. Upper lungs are clear. No acute bone finding. IMPRESSION: Previous aortic valve replacement. Borderline cardiomegaly and aortic atherosclerosis. Bilateral small pleural effusions with dependent pulmonary atelectasis. Electronically Signed   By: Paulina Fusi M.D.   On: 04/28/2021 12:03   ECHOCARDIOGRAM COMPLETE  Result Date: 04/29/2021    ECHOCARDIOGRAM REPORT   Patient Name:   [name] [facility] Date of Exam: 04/29/2021 Medical Rec #:  [id number]   Height:       73.0 in Accession #:    [id number]  Weight:       205.0 lb Date of Birth:  06-Jun-1940    BSA:          2.174 m Patient Age:    80 years    BP:           121/64 mmHg Patient Gender: M           HR:           67 bpm. Exam Location:  Inpatient Procedure: 2D Echo, Cardiac Doppler and Color Doppler Indications:    CHF  History:        Patient has prior history of Echocardiogram examinations.  Risk                 Factors:Hypertension, Diabetes and Sleep Apnea.                 Aortic Valve: bioprosthetic valve is present in the aortic                 position.  Sonographer:    Cleatis Polka Referring Phys: [id number] LINDSAY B ROBERTS IMPRESSIONS  1. S/P AVR with mild AI; mean gradient 21 mmHg; DI 0.39; compared to 01/14/20, mean gradient has increased (previously 16 mmHg).  2. Left ventricular ejection fraction, by estimation, is 55 to 60%. The left ventricle has normal function. The left ventricle has no regional wall motion abnormalities. The left ventricular internal cavity size was mildly dilated. There is mild left ventricular hypertrophy. Left ventricular diastolic parameters are consistent with Grade II diastolic dysfunction (pseudonormalization). Elevated left atrial pressure.  3. Right ventricular systolic function is normal. The right ventricular size is normal. There is mildly elevated pulmonary artery systolic pressure.  4. Left atrial size was moderately dilated.  5. The mitral valve is normal in structure. Mild mitral valve regurgitation. No evidence of mitral stenosis. Moderate mitral annular calcification.  6. The aortic valve has been repaired/replaced. Aortic valve regurgitation is mild. There is a bioprosthetic valve present in the aortic position.  7. The inferior vena cava is dilated in size with >50% respiratory variability, suggesting right atrial pressure of 8 mmHg. Comparison(s): Prior images unable to be directly viewed, comparison made by report only. FINDINGS  Left Ventricle: Left ventricular ejection fraction, by estimation, is 55 to 60%. The left ventricle has normal function. The left ventricle has no regional wall motion abnormalities. The left ventricular internal cavity size was mildly dilated. There is  mild left ventricular hypertrophy. Left ventricular diastolic parameters are consistent with Grade II diastolic dysfunction (pseudonormalization). Elevated left atrial  pressure. Right Ventricle: The right ventricular size is normal. Right ventricular systolic function is normal. There is mildly elevated pulmonary artery systolic pressure. The tricuspid regurgitant velocity is 2.99 m/s, and with an assumed right atrial  pressure of 8 mmHg, the estimated right ventricular systolic pressure is [id number] mmHg. Left Atrium: Left atrial size was moderately dilated. Right Atrium: Right atrial size was normal in size. Pericardium: There is no evidence of pericardial effusion. Mitral Valve: The mitral valve is normal in structure. Moderate mitral annular calcification. Mild mitral valve regurgitation. No evidence of mitral valve stenosis. MV peak gradient, 10.5 mmHg. The mean mitral valve gradient is 3.5 mmHg. Tricuspid Valve: The tricuspid valve is normal in structure. Tricuspid valve regurgitation is mild . No evidence of tricuspid stenosis. Aortic Valve: The aortic valve has been repaired/replaced. Aortic valve regurgitation is mild. Aortic regurgitation PHT measures 445 msec. Aortic valve mean gradient measures 20.5 mmHg. Aortic valve peak gradient measures 35.6 mmHg. Aortic valve area, by  VTI measures 1.23 cm. There is a bioprosthetic valve present in the aortic position. Pulmonic Valve: The pulmonic valve was normal in structure. Pulmonic valve regurgitation is trivial. No evidence of pulmonic stenosis. Aorta: The aortic root is normal in size and structure. Venous: The inferior vena cava is dilated in size with greater than 50% respiratory variability, suggesting right atrial pressure of 8 mmHg. IAS/Shunts: No atrial level shunt detected by color flow Doppler. Additional Comments: S/P AVR with mild AI; mean gradient 21 mmHg; DI 0.39; compared to 01/14/20, mean gradient has increased (previously 16 mmHg).  LEFT VENTRICLE PLAX 2D LVIDd:         5.70 cm      Diastology LVIDs:         4.30 cm      LV e' medial:    5.55 cm/s LV PW:         1.40 cm      LV E/e' medial:  27.9 LV IVS:         1.10 cm      LV e' lateral:   7.94 cm/s LVOT diam:     2.00 cm      LV E/e' lateral: 19.5 LV SV:         86 LV SV Index:   40 LVOT Area:     3.14 cm  LV Volumes (MOD) LV vol d, MOD A2C: 210.0 ml LV vol d, MOD A4C: 207.0 ml LV vol s, MOD A2C: 90.4 ml LV vol s, MOD A4C: 87.3 ml LV SV MOD A2C:     119.6 ml LV SV MOD A4C:     207.0 ml LV SV MOD BP:      126.1 ml RIGHT VENTRICLE             IVC RV Basal diam:  3.60 cm     IVC diam: 2.70 cm RV Mid diam:    3.40 cm RV S prime:     12.30 cm/s TAPSE (M-mode): 2.2 cm LEFT ATRIUM             Index        RIGHT ATRIUM           Index LA diam:        4.70 cm 2.16 cm/m   RA Area:     15.60 cm LA Vol (A2C):   94.5 ml 43.46 ml/m  RA Volume:   37.70 ml  17.34 ml/m LA Vol (A4C):   89.6 ml 41.21 ml/m LA Biplane Vol: 94.4 ml 43.42 ml/m  AORTIC VALVE AV Area (Vmax):    1.29 cm AV Area (Vmean):   1.25 cm AV Area (VTI):     1.23 cm AV Vmax:  298.50 cm/s AV Vmean:          218.000 cm/s AV VTI:            0.702 m AV Peak Grad:      35.6 mmHg AV Mean Grad:      20.5 mmHg LVOT Vmax:         123.00 cm/s LVOT Vmean:        86.650 cm/s LVOT VTI:          0.275 m LVOT/AV VTI ratio: 0.39 AI PHT:            445 msec  AORTA Ao Root diam: 3.00 cm Ao Asc diam:  3.60 cm MITRAL VALVE                TRICUSPID VALVE MV Area (PHT): 3.99 cm     TR Peak grad:   35.8 mmHg MV Area VTI:   1.84 cm     TR Vmax:        299.00 cm/s MV Peak grad:  10.5 mmHg MV Mean grad:  3.5 mmHg     SHUNTS MV Vmax:       1.62 m/s     Systemic VTI:  0.28 m MV Vmean:      89.6 cm/s    Systemic Diam: 2.00 cm MV Decel Time: 190 msec MR Peak grad: 79.0 mmHg MR Mean grad: 59.0 mmHg MR Vmax:      444.50 cm/s MR Vmean:     373.0 cm/s MV E velocity: 155.00 cm/s MV A velocity: 111.00 cm/s MV E/A ratio:  1.40 Kirk Ruths MD Electronically signed by Kirk Ruths MD Signature Date/Time: 04/29/2021/3:33:49 PM    Final     Cardiac Studies   Echo 04/29/21: 1. S/P AVR with mild AI; mean gradient 21 mmHg; DI 0.39;  compared to  01/14/20, mean gradient has increased (previously 16 mmHg).   2. Left ventricular ejection fraction, by estimation, is 55 to 60%. The  left ventricle has normal function. The left ventricle has no regional  wall motion abnormalities. The left ventricular internal cavity size was  mildly dilated. There is mild left  ventricular hypertrophy. Left ventricular diastolic parameters are  consistent with Grade II diastolic dysfunction (pseudonormalization).  Elevated left atrial pressure.   3. Right ventricular systolic function is normal. The right ventricular  size is normal. There is mildly elevated pulmonary artery systolic  pressure.   4. Left atrial size was moderately dilated.   5. The mitral valve is normal in structure. Mild mitral valve  regurgitation. No evidence of mitral stenosis. Moderate mitral annular  calcification.   6. The aortic valve has been repaired/replaced. Aortic valve  regurgitation is mild. There is a bioprosthetic valve present in the  aortic position.   7. The inferior vena cava is dilated in size with >50% respiratory  variability, suggesting right atrial pressure of 8 mmHg.   Patient Profile     80 y.o. male with a hx of past medical history of OSA on CPAP, aortic stenosis status post bioprosthetic replacement '13 ([facility]), CAD hyperlipidemia, hypertension, sinus bradycardia/fascicular block, mildly dilated ascending aorta who is being seen 04/28/2021 for the evaluation of dyspnea on exertion.  Assessment & Plan    Hx of AVR - echo yesterday showed increased mean gradient from 16 to 21 mmHg - likely contributing to dyspnea along with anemia, but suspect improving his anemia will have the biggest impact on symtpoms - do not suspect he is a candidate  for valve procedure   Chronic diastolic heart failure - grade 2 DD on echo, normal LVEF, mild LVH - BNP on arrival 572 - albumin 3.2 - does not appear volume up on exam   Iron deficiency anemia -  iron low - hb 8.4 --> 9.1 after 1U PRBC - may need IV iron, will defer to primary   First degree heart block Sinus bradycardia - HR dropping into the 50s while awake- looks like he is primarily in the 60s before today - initially planned to hold BB this morning, but had 8-beat run of multi-focal VT - asymptomatic - giving 12.5 mg lopressor now - if heart rate stable, can give additional 12.5 mg mid-morning - K 4.2, will check a Mg   Dyspnea - likely multifactorial, but suspect anemia is driving his symptoms in the setting of AI and HFpEF        For questions or updates, please contact [facility] Please consult [email or web address] for contact info under        Signed, Ledora Bottcher, PA  04/30/2021, 8:29 AM    I have examined the patient and reviewed assessment and plan and discussed with patient.  Agree with above as stated.  Normal LVEF by echo.  Valve function appears to be adequate- moderate gradient but not to the point of needing valve replacement.  No evidence of hemolysis noted. Retic count increased.  Unclear reason for low iron stores.  I asked them to f/u with PCP.  They could consider GI eval or repeat hemoccult test to r/u bleeding source.    OK to continue low dose metoprolol 12.5 mg BID.   Cardiology f/u with Dr. Tamala Julian.   Larae Grooms

## 2021-04-30 NOTE — Progress Notes (Signed)
Pt had 8 beat run of multifocal V Tach. Cardiology contacted and advised to give Metoprolol 12.5 mg.

## 2021-04-30 NOTE — Progress Notes (Signed)
Advised attending that pt had an episode of bigeminy, but pt was asymptomatic. Advised to move forward with discharge.

## 2021-04-30 NOTE — Evaluation (Signed)
Occupational Therapy Evaluation Patient Details Name: Patrick Henson MRN: [id number] DOB: 01-14-1941 Today's Date: 04/30/2021   History of Present Illness Pt is an 80 y/o male admitted 11/30 secondary to symptomatic anemia. PMH includes OSA on CPAP, CAD< HTN, BPPV, s/p AVR, DM.   Clinical Impression   PTA, pt lives with spouse and typically ambulatory without AD, able to complete ADLs but benefits from sequencing cues due to cognitive deficits. Pt presents now fairly close to baseline, able to mobilize without AD via min guard and complete LB ADLs with min guard at most. Daughter present and endorses good family support at DC. Will continue to follow while admitted to progress safety with mobility/LB ADLs. Rec HHOT follow-up if family agreeable.   VSS on RA      Recommendations for follow up therapy are one component of a multi-disciplinary discharge planning process, led by the attending physician.  Recommendations may be updated based on patient status, additional functional criteria and insurance authorization.   Follow Up Recommendations  Home health OT    Assistance Recommended at Discharge Frequent or constant Supervision/Assistance  Functional Status Assessment  Patient has had a recent decline in their functional status and demonstrates the ability to make significant improvements in function in a reasonable and predictable amount of time.  Equipment Recommendations  None recommended by OT    Recommendations for Other Services       Precautions / Restrictions Precautions Precautions: Fall Restrictions Weight Bearing Restrictions: No      Mobility Bed Mobility Overal bed mobility: Needs Assistance Bed Mobility: Supine to Sit;Sit to Supine     Supine to sit: Supervision Sit to supine: Supervision        Transfers Overall transfer level: Needs assistance Equipment used: Rolling walker (2 wheels);None Transfers: Sit to/from Stand Sit to Stand: Min guard            General transfer comment: Min guard for safety and steadying.      Balance Overall balance assessment: Needs assistance Sitting-balance support: No upper extremity supported;Feet supported Sitting balance-Leahy Scale: Good     Standing balance support: Single extremity supported Standing balance-Leahy Scale: Fair                             ADL either performed or assessed with clinical judgement   ADL Overall ADL's : Needs assistance/impaired Eating/Feeding: Independent;Sitting   Grooming: Supervision/safety;Standing;Wash/dry hands Grooming Details (indicate cue type and reason): cues for sequencing Upper Body Bathing: Supervision/ safety;Sitting   Lower Body Bathing: Min guard;Sit to/from stand   Upper Body Dressing : Supervision/safety;Sitting   Lower Body Dressing: Min guard;Sit to/from stand   Toilet Transfer: Min guard;Ambulation;Regular Teacher, adult education Details (indicate cue type and reason): stood at toilet for urination, min guard for safety Toileting- Clothing Manipulation and Hygiene: Min guard;Sit to/from stand Toileting - Clothing Manipulation Details (indicate cue type and reason): standing for urination     Functional mobility during ADLs: Min guard General ADL Comments: Pt close to baseline with cues for sequencing needed at baseline. Noted to pick up RW for mobility, able to move without device but intermittent reaching out to furniture     Vision Baseline Vision/History: 1 Wears glasses Ability to See in Adequate Light: 0 Adequate Patient Visual Report: No change from baseline Vision Assessment?: No apparent visual deficits     Perception     Praxis      Pertinent Vitals/Pain Pain Assessment: No/denies  pain     Hand Dominance Right   Extremity/Trunk Assessment Upper Extremity Assessment Upper Extremity Assessment: Overall WFL for tasks assessed   Lower Extremity Assessment Lower Extremity Assessment: Defer to PT  evaluation   Cervical / Trunk Assessment Cervical / Trunk Assessment: Kyphotic   Communication Communication Communication: No difficulties   Cognition Arousal/Alertness: Awake/alert Behavior During Therapy: WFL for tasks assessed/performed Overall Cognitive Status: History of cognitive impairments - at baseline                                 General Comments: hx of dementia, requires cues for sequencing at home, increasing difficulty with memory     General Comments  daughter present and supportive. BP stable pre and post activity, O2 95%    Exercises     Shoulder Instructions      Home Living Family/patient expects to be discharged to:: Private residence Living Arrangements: Spouse/significant other Available Help at Discharge: Family;Available 24 hours/day Type of Home: House Home Access: Stairs to enter [employer] of Steps: 3 Entrance Stairs-Rails: Right Home Layout: One level     Bathroom Shower/Tub: Producer, television/film/video: Standard     Home Equipment: None          Prior Functioning/Environment Prior Level of Function : Independent/Modified Independent;Needs assist  Cognitive Assist : ADLs (cognitive)   ADLs (Cognitive): Step by step cues       Mobility Comments: no use of AD, no falls ADLs Comments: Able to stand for showers,complete ADLs with sequencing cues due to cognition        OT Problem List: Impaired balance (sitting and/or standing);Decreased activity tolerance;Decreased cognition;Decreased safety awareness      OT Treatment/Interventions: Self-care/ADL training;Therapeutic exercise;DME and/or AE instruction;Therapeutic activities;Patient/family education;Balance training    OT Goals(Current goals can be found in the care plan section) Acute Rehab OT Goals Patient Stated Goal: happy to go home today OT Goal Formulation: With patient/family Time For Goal Achievement: 05/14/21 Potential to Achieve  Goals: Good  OT Frequency: Min 2X/week   Barriers to D/C:            Co-evaluation              AM-PAC OT "6 Clicks" Daily Activity     Outcome Measure Help from another person eating meals?: None Help from another person taking care of personal grooming?: A Little Help from another person toileting, which includes using toliet, bedpan, or urinal?: A Little Help from another person bathing (including washing, rinsing, drying)?: A Little Help from another person to put on and taking off regular upper body clothing?: A Little Help from another person to put on and taking off regular lower body clothing?: A Little 6 Click Score: 19   End of Session Equipment Utilized During Treatment: Gait belt  Activity Tolerance: Patient tolerated treatment well Patient left: in bed;with call bell/phone within reach;with family/visitor present  OT Visit Diagnosis: Unsteadiness on feet (R26.81);Other abnormalities of gait and mobility (R26.89);Muscle weakness (generalized) (M62.81);Other symptoms and signs involving cognitive function                Time: 1321-1345 OT Time Calculation (min): 24 min Charges:  OT General Charges $OT Visit: 1 Visit OT Evaluation $OT Eval Low Complexity: 1 Low  Bradd Canary, OTR/L Acute Rehab Services Office: [phone]   [name] 04/30/2021, 1:55 PM

## 2021-05-07 DIAGNOSIS — D649 Anemia, unspecified: Secondary | ICD-10-CM | POA: Diagnosis not present

## 2021-05-07 DIAGNOSIS — I700 Atherosclerosis of aorta: Secondary | ICD-10-CM | POA: Diagnosis not present

## 2021-05-07 DIAGNOSIS — N1831 Chronic kidney disease, stage 3a: Secondary | ICD-10-CM | POA: Diagnosis not present

## 2021-05-07 DIAGNOSIS — Z09 Encounter for follow-up examination after completed treatment for conditions other than malignant neoplasm: Secondary | ICD-10-CM | POA: Diagnosis not present

## 2021-05-07 DIAGNOSIS — E1142 Type 2 diabetes mellitus with diabetic polyneuropathy: Secondary | ICD-10-CM | POA: Diagnosis not present

## 2021-05-07 DIAGNOSIS — I10 Essential (primary) hypertension: Secondary | ICD-10-CM | POA: Diagnosis not present

## 2021-05-07 DIAGNOSIS — I480 Paroxysmal atrial fibrillation: Secondary | ICD-10-CM | POA: Diagnosis not present

## 2021-05-07 DIAGNOSIS — D696 Thrombocytopenia, unspecified: Secondary | ICD-10-CM | POA: Diagnosis not present

## 2021-05-14 NOTE — Addendum Note (Signed)
Addended by: Reesa Chew on: 05/14/2021 01:28 PM   Modules accepted: Orders

## 2021-05-27 DIAGNOSIS — I469 Cardiac arrest, cause unspecified: Secondary | ICD-10-CM | POA: Diagnosis not present

## 2021-05-27 DIAGNOSIS — R4020 Unspecified coma: Secondary | ICD-10-CM | POA: Diagnosis not present

## 2021-05-27 DIAGNOSIS — R739 Hyperglycemia, unspecified: Secondary | ICD-10-CM | POA: Diagnosis not present

## 2021-05-27 DIAGNOSIS — I499 Cardiac arrhythmia, unspecified: Secondary | ICD-10-CM | POA: Diagnosis not present

## 2021-05-30 DIAGNOSIS — 419620001 Death: Secondary | SNOMED CT | POA: Diagnosis not present

## 2021-05-30 NOTE — Telephone Encounter (Signed)
Patient is scheduled for CPAP Titration on 05-31-20. Patient understands his titration study will be done at [facility] sleep lab. Patient understands he will receive a letter in a week or so detailing appointment, date, time, and location.

## 2021-05-30 DEATH — deceased

## 2021-05-31 ENCOUNTER — Encounter (HOSPITAL_BASED_OUTPATIENT_CLINIC_OR_DEPARTMENT_OTHER): Payer: Medicare HMO | Admitting: Cardiology

## 2021-06-09 ENCOUNTER — Telehealth: Payer: Self-pay | Admitting: *Deleted

## 2021-06-09 NOTE — Telephone Encounter (Signed)
-----   Message from Quintella Reichert, MD sent at 05/19/2021  6:32 PM EST ----- Regarding: RE: sleep aide Would have patient take Melatonin 2mg  1 hour prior to going to bed at sleep lab ----- Message ----- From: , CMA Sent: 05/19/2021   3:33 PM EST To: 05/21/2021, MD Subject: sleep aide                                     Quintella Reichert wanted me to ask  Dr Sharlot Gowda about getting Mr Rosiak  a sleeping aide for his study 05/31/21?  Please advise

## 2021-06-09 NOTE — Telephone Encounter (Signed)
Patient passed away 30-May-2021.

## 2021-06-30 DEATH — deceased

## 2023-07-13 IMAGING — DX DG CHEST 2V
2 series · 2 of 2 positions shown · non-contrast
Comparison: 07/18/2011

CLINICAL DATA: Shortness of breath

EXAM:
CHEST - 2 VIEW

[chest lat]
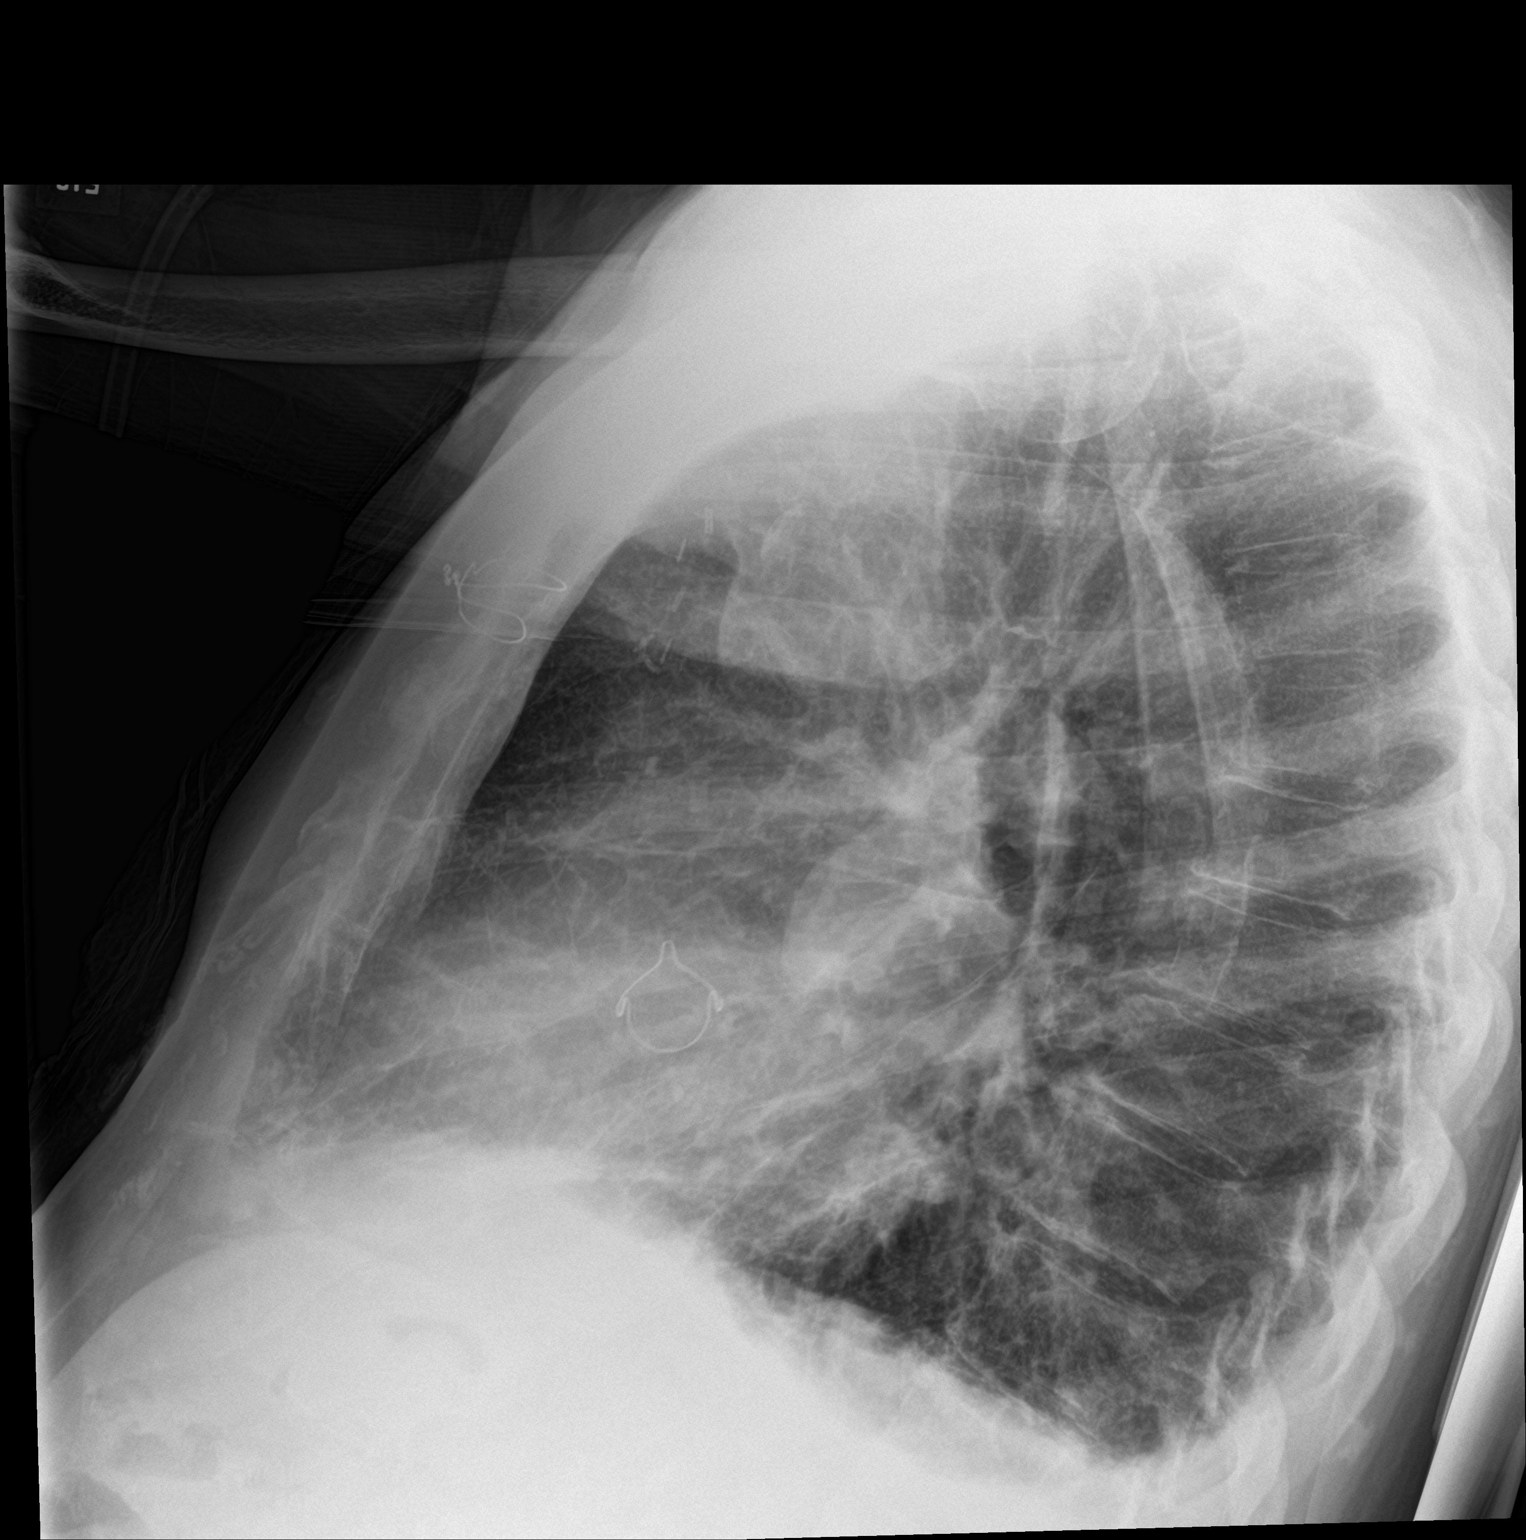

[chest ap]
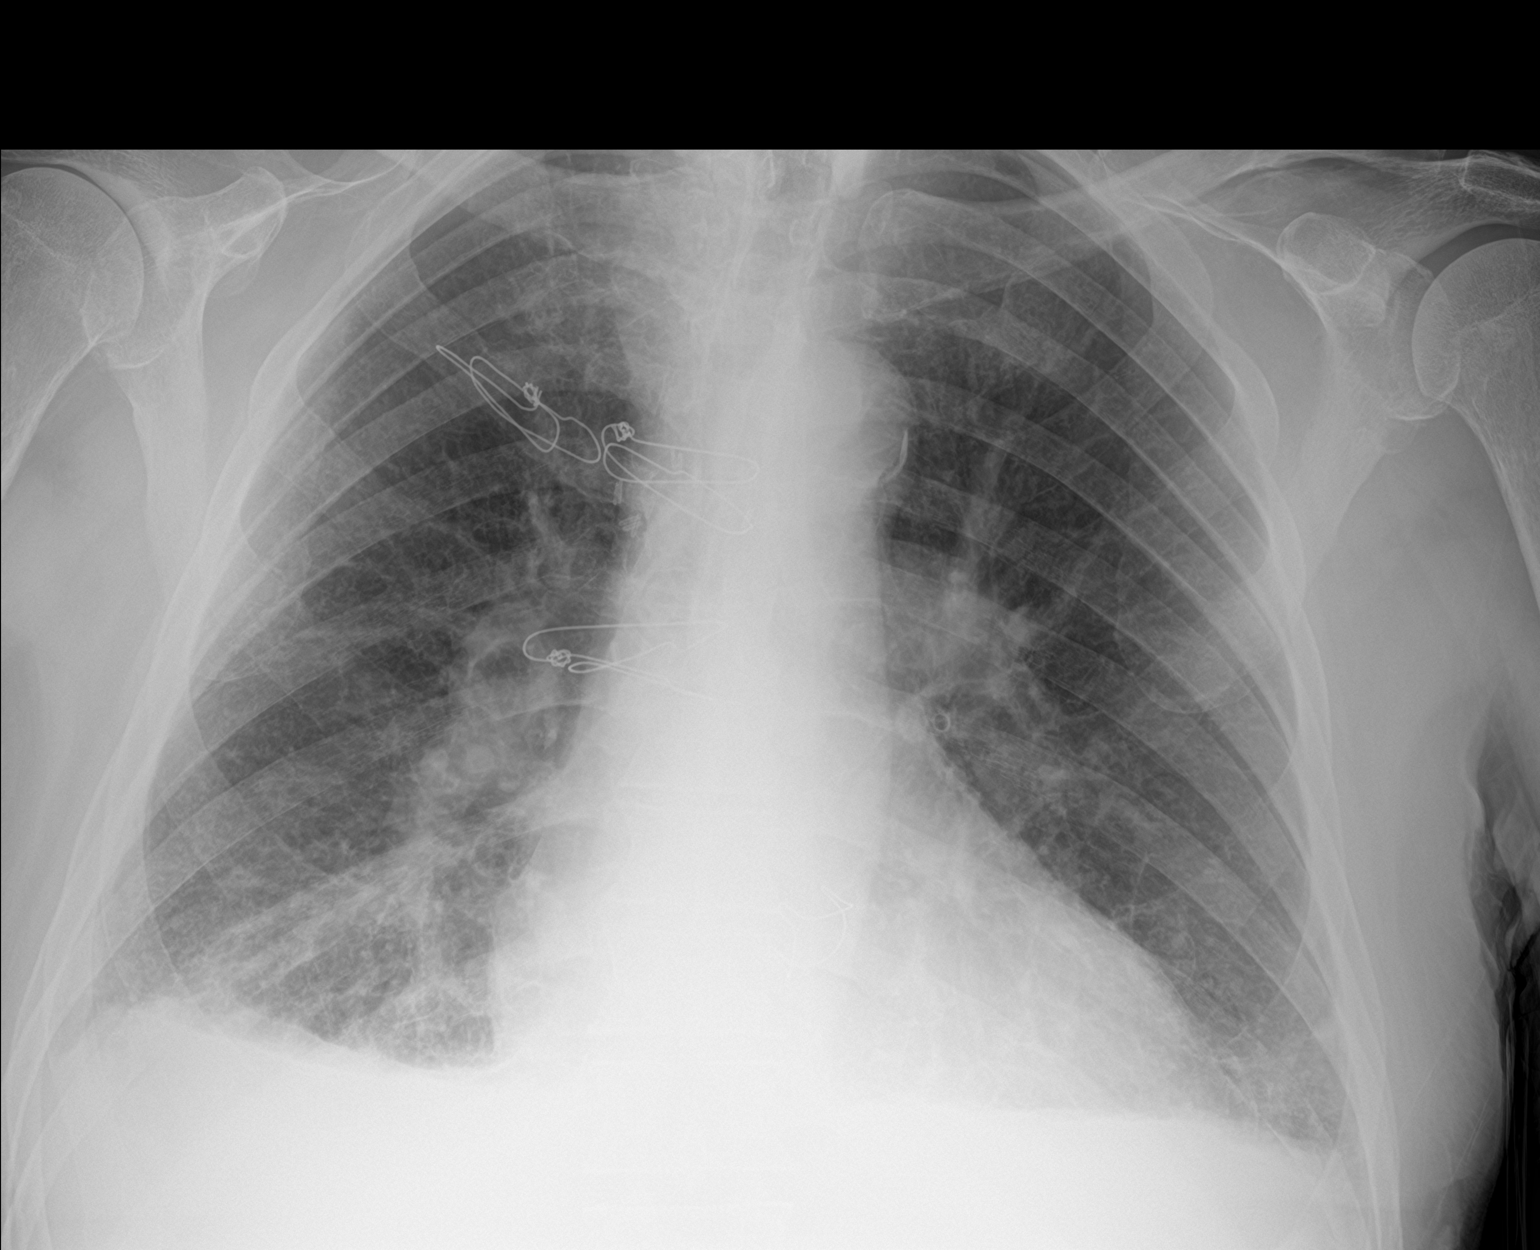

[2 of 2 positions shown; findings below may reference images not displayed]

FINDINGS: Previous sternotomy and aortic valve replacement. Heart size upper
limits of normal. Atherosclerosis and tortuosity of the thoracic
aorta. Bilateral pleural effusions with dependent atelectasis. Upper
lungs are clear. No acute bone finding.
IMPRESSION: Previous aortic valve replacement. Borderline cardiomegaly and
aortic atherosclerosis. Bilateral small pleural effusions with
dependent pulmonary atelectasis.
